# Patient Record
Sex: Male | Born: 1945
Health system: Southern US, Community
[De-identification: ages and names within clinical notes are randomized; demographics above are authoritative.]

## PROBLEM LIST (undated history)

## (undated) DIAGNOSIS — R0600 Dyspnea, unspecified: Secondary | ICD-10-CM

## (undated) DIAGNOSIS — I739 Peripheral vascular disease, unspecified: Secondary | ICD-10-CM

## (undated) DIAGNOSIS — E785 Hyperlipidemia, unspecified: Secondary | ICD-10-CM

## (undated) DIAGNOSIS — N400 Enlarged prostate without lower urinary tract symptoms: Secondary | ICD-10-CM

## (undated) DIAGNOSIS — N401 Enlarged prostate with lower urinary tract symptoms: Secondary | ICD-10-CM

## (undated) DIAGNOSIS — Z87442 Personal history of urinary calculi: Secondary | ICD-10-CM

## (undated) DIAGNOSIS — K219 Gastro-esophageal reflux disease without esophagitis: Secondary | ICD-10-CM

## (undated) DIAGNOSIS — I6523 Occlusion and stenosis of bilateral carotid arteries: Secondary | ICD-10-CM

## (undated) DIAGNOSIS — G629 Polyneuropathy, unspecified: Secondary | ICD-10-CM

## (undated) DIAGNOSIS — I10 Essential (primary) hypertension: Secondary | ICD-10-CM

## (undated) DIAGNOSIS — N281 Cyst of kidney, acquired: Secondary | ICD-10-CM

## (undated) DIAGNOSIS — R0609 Other forms of dyspnea: Secondary | ICD-10-CM

## (undated) DIAGNOSIS — R600 Localized edema: Secondary | ICD-10-CM

## (undated) DIAGNOSIS — I779 Disorder of arteries and arterioles, unspecified: Secondary | ICD-10-CM

## (undated) DIAGNOSIS — Z86010 Personal history of colonic polyps: Principal | ICD-10-CM

## (undated) DIAGNOSIS — F419 Anxiety disorder, unspecified: Secondary | ICD-10-CM

## (undated) DIAGNOSIS — N200 Calculus of kidney: Secondary | ICD-10-CM

## (undated) DIAGNOSIS — T7840XA Allergy, unspecified, initial encounter: Secondary | ICD-10-CM

## (undated) DIAGNOSIS — N138 Other obstructive and reflux uropathy: Secondary | ICD-10-CM

## (undated) DIAGNOSIS — M1990 Unspecified osteoarthritis, unspecified site: Secondary | ICD-10-CM

## (undated) HISTORY — PX: CERVICAL DISC SURGERY: SHX588

## (undated) HISTORY — DX: Allergy, unspecified, initial encounter: T78.40XA

## (undated) HISTORY — PX: POLYPECTOMY: SHX149

## (undated) HISTORY — DX: Peripheral vascular disease, unspecified: I73.9

## (undated) HISTORY — DX: Personal history of colonic polyps: Z86.010

## (undated) HISTORY — DX: Essential (primary) hypertension: I10

## (undated) HISTORY — DX: Gastro-esophageal reflux disease without esophagitis: K21.9

## (undated) HISTORY — PX: COLONOSCOPY: SHX174

## (undated) HISTORY — DX: Disorder of arteries and arterioles, unspecified: I77.9

## (undated) HISTORY — DX: Benign prostatic hyperplasia without lower urinary tract symptoms: N40.0

## (undated) HISTORY — PX: BURN TREATMENT: SHX158

## (undated) HISTORY — DX: Hyperlipidemia, unspecified: E78.5

## (undated) HISTORY — DX: Anxiety disorder, unspecified: F41.9

## (undated) HISTORY — PX: OTHER SURGICAL HISTORY: SHX169

---

## 2004-03-05 ENCOUNTER — Ambulatory Visit: Payer: Self-pay | Admitting: Internal Medicine

## 2004-08-30 ENCOUNTER — Ambulatory Visit: Payer: Self-pay | Admitting: Internal Medicine

## 2004-10-08 ENCOUNTER — Ambulatory Visit: Payer: Self-pay | Admitting: Internal Medicine

## 2005-06-06 ENCOUNTER — Ambulatory Visit: Payer: Self-pay | Admitting: Internal Medicine

## 2005-11-26 ENCOUNTER — Ambulatory Visit: Payer: Self-pay | Admitting: Family Medicine

## 2005-12-06 ENCOUNTER — Ambulatory Visit: Payer: Self-pay | Admitting: Internal Medicine

## 2005-12-13 ENCOUNTER — Ambulatory Visit: Payer: Self-pay | Admitting: Internal Medicine

## 2006-01-07 LAB — HM COLONOSCOPY: HM Colonoscopy: NORMAL

## 2006-05-19 ENCOUNTER — Telehealth (INDEPENDENT_AMBULATORY_CARE_PROVIDER_SITE_OTHER): Payer: Self-pay | Admitting: *Deleted

## 2006-05-20 ENCOUNTER — Ambulatory Visit: Payer: Self-pay | Admitting: Internal Medicine

## 2006-05-20 DIAGNOSIS — E785 Hyperlipidemia, unspecified: Secondary | ICD-10-CM

## 2006-05-20 DIAGNOSIS — N138 Other obstructive and reflux uropathy: Secondary | ICD-10-CM

## 2006-05-20 DIAGNOSIS — B029 Zoster without complications: Secondary | ICD-10-CM | POA: Insufficient documentation

## 2006-05-20 DIAGNOSIS — I10 Essential (primary) hypertension: Secondary | ICD-10-CM

## 2006-05-20 DIAGNOSIS — N529 Male erectile dysfunction, unspecified: Secondary | ICD-10-CM | POA: Insufficient documentation

## 2006-05-20 DIAGNOSIS — F411 Generalized anxiety disorder: Secondary | ICD-10-CM | POA: Insufficient documentation

## 2006-05-20 DIAGNOSIS — N401 Enlarged prostate with lower urinary tract symptoms: Secondary | ICD-10-CM

## 2006-05-21 LAB — CONVERTED CEMR LAB
Albumin: 3.9 g/dL (ref 3.5–5.2)
GFR calc Af Amer: 111 mL/min
Glucose, Bld: 102 mg/dL — ABNORMAL HIGH (ref 70–99)
Potassium: 4.4 meq/L (ref 3.5–5.1)
Sodium: 140 meq/L (ref 135–145)

## 2006-06-16 ENCOUNTER — Encounter: Payer: Self-pay | Admitting: Internal Medicine

## 2006-06-16 DIAGNOSIS — J309 Allergic rhinitis, unspecified: Secondary | ICD-10-CM | POA: Insufficient documentation

## 2006-06-19 ENCOUNTER — Encounter (INDEPENDENT_AMBULATORY_CARE_PROVIDER_SITE_OTHER): Payer: Self-pay | Admitting: *Deleted

## 2006-10-06 ENCOUNTER — Ambulatory Visit: Payer: Self-pay | Admitting: Gastroenterology

## 2006-10-06 DIAGNOSIS — Z860100 Personal history of colon polyps, unspecified: Secondary | ICD-10-CM

## 2006-10-06 HISTORY — DX: Personal history of colon polyps, unspecified: Z86.0100

## 2006-10-06 HISTORY — DX: Personal history of colonic polyps: Z86.010

## 2008-01-23 ENCOUNTER — Emergency Department: Payer: Self-pay | Admitting: Emergency Medicine

## 2008-03-03 ENCOUNTER — Emergency Department: Payer: Self-pay | Admitting: Emergency Medicine

## 2008-06-26 ENCOUNTER — Emergency Department (HOSPITAL_COMMUNITY): Admission: EM | Admit: 2008-06-26 | Discharge: 2008-06-26 | Payer: Self-pay | Admitting: Emergency Medicine

## 2013-05-28 ENCOUNTER — Ambulatory Visit
Admission: RE | Admit: 2013-05-28 | Discharge: 2013-05-28 | Disposition: A | Discharge status: Home / Self Care | Payer: Commercial Managed Care - HMO | Source: Ambulatory Visit | Attending: Nurse Practitioner | Admitting: Nurse Practitioner

## 2013-05-28 ENCOUNTER — Ambulatory Visit
Admission: RE | Admit: 2013-05-28 | Discharge: 2013-05-28 | Disposition: A | Discharge status: Home / Self Care | Payer: Medicare HMO | Source: Ambulatory Visit | Attending: Nurse Practitioner | Admitting: Nurse Practitioner

## 2013-05-28 ENCOUNTER — Other Ambulatory Visit: Payer: Self-pay | Admitting: Nurse Practitioner

## 2013-05-28 DIAGNOSIS — M79671 Pain in right foot: Secondary | ICD-10-CM

## 2013-05-28 DIAGNOSIS — M79672 Pain in left foot: Principal | ICD-10-CM

## 2013-08-04 ENCOUNTER — Telehealth: Payer: Self-pay | Admitting: Internal Medicine

## 2013-08-04 NOTE — Telephone Encounter (Signed)
Brian Clarke from [facility] called office stating that pt has [place] insurance with your name listed as his PCP. Delia Chimes, DNP has been treating patient since 04/2013 and has sent him to see Dr. Einar Gip in 05/2013 and 06/2013 and now needs follow up and [employer] is requiring referral. Would you be able to accommodate pt to re-est and put referral in for Dr. Einar Gip? If you would like to speak with Delia Chimes her office # is [phone]

## 2013-08-05 NOTE — Telephone Encounter (Signed)
You can try to get him in soon to reestablish but I can't approve referral till I see him. Please get the records from Dr Toy Cookey   Please call her office to see if I need to call her---like if she thinks he has an emergent situation or something life threatening, etc

## 2013-08-05 NOTE — Telephone Encounter (Signed)
Spoke with Dr. Corky Sing office. No it is not life threatening. LVM for pt to call back and ask for me to schedule re-est pt appt. Dr. Toy Cookey and Dr. Einar Gip notes in your in-box.

## 2013-08-06 NOTE — Telephone Encounter (Signed)
Records reviewed  Claudication with subclinical LE arterial disease Will review at his upcoming visit here

## 2013-08-20 ENCOUNTER — Ambulatory Visit (INDEPENDENT_AMBULATORY_CARE_PROVIDER_SITE_OTHER): Payer: Commercial Managed Care - HMO | Admitting: Internal Medicine

## 2013-08-20 ENCOUNTER — Encounter: Payer: Self-pay | Admitting: Internal Medicine

## 2013-08-20 ENCOUNTER — Encounter (INDEPENDENT_AMBULATORY_CARE_PROVIDER_SITE_OTHER): Payer: Self-pay

## 2013-08-20 VITALS — BP 140/70 | HR 69 | Temp 98.0°F | Ht 69.0 in | Wt 234.0 lb

## 2013-08-20 DIAGNOSIS — I739 Peripheral vascular disease, unspecified: Secondary | ICD-10-CM

## 2013-08-20 DIAGNOSIS — E785 Hyperlipidemia, unspecified: Secondary | ICD-10-CM

## 2013-08-20 DIAGNOSIS — Z23 Encounter for immunization: Secondary | ICD-10-CM

## 2013-08-20 DIAGNOSIS — I10 Essential (primary) hypertension: Secondary | ICD-10-CM

## 2013-08-20 DIAGNOSIS — N400 Enlarged prostate without lower urinary tract symptoms: Secondary | ICD-10-CM

## 2013-08-20 NOTE — Patient Instructions (Signed)
Exercise to Lose Weight Exercise and a healthy diet may help you lose weight. Your doctor may suggest specific exercises. EXERCISE IDEAS AND TIPS  Choose low-cost things you enjoy doing, such as walking, bicycling, or exercising to workout videos.  Take stairs instead of the elevator.  Walk during your lunch break.  Park your car further away from work or school.  Go to a gym or an exercise class.  Start with 5 to 10 minutes of exercise each day. Build up to 30 minutes of exercise 4 to 6 days a week.  Wear shoes with good support and comfortable clothes.  Stretch before and after working out.  Work out until you breathe harder and your heart beats faster.  Drink extra water when you exercise.  Do not do so much that you hurt yourself, feel dizzy, or get very short of breath. Exercises that burn about 150 calories:  Running 1  miles in 15 minutes.  Playing volleyball for 45 to 60 minutes.  Washing and waxing a car for 45 to 60 minutes.  Playing touch football for 45 minutes.  Walking 1  miles in 35 minutes.  Pushing a stroller 1  miles in 30 minutes.  Playing basketball for 30 minutes.  Raking leaves for 30 minutes.  Bicycling 5 miles in 30 minutes.  Walking 2 miles in 30 minutes.  Dancing for 30 minutes.  Shoveling snow for 15 minutes.  Swimming laps for 20 minutes.  Walking up stairs for 15 minutes.  Bicycling 4 miles in 15 minutes.  Gardening for 30 to 45 minutes.  Jumping rope for 15 minutes.  Washing windows or floors for 45 to 60 minutes. Document Released: 01/26/2010 Document Revised: 03/18/2011 Document Reviewed: 01/26/2010 ExitCare Patient Information 2015 Exit[employer]. This information is not intended to replace advice given to you by your health care provider. Make sure you discuss any questions you have with your health care provider. DASH Eating Plan DASH stands for "Dietary Approaches to Stop Hypertension." The DASH eating plan is a  healthy eating plan that has been shown to reduce high blood pressure (hypertension). Additional health benefits may include reducing the risk of type 2 diabetes mellitus, heart disease, and stroke. The DASH eating plan may also help with weight loss. WHAT DO I NEED TO KNOW ABOUT THE DASH EATING PLAN? For the DASH eating plan, you will follow these general guidelines:  Choose foods with a percent daily value for sodium of less than 5% (as listed on the food label).  Use salt-free seasonings or herbs instead of table salt or sea salt.  Check with your health care provider or pharmacist before using salt substitutes.  Eat lower-sodium products, often labeled as "lower sodium" or "no salt added."  Eat fresh foods.  Eat more vegetables, fruits, and low-fat dairy products.  Choose whole grains. Look for the word "whole" as the first word in the ingredient list.  Choose fish and skinless chicken or turkey more often than red meat. Limit fish, poultry, and meat to 6 oz (170 g) each day.  Limit sweets, desserts, sugars, and sugary drinks.  Choose heart-healthy fats.  Limit cheese to 1 oz (28 g) per day.  Eat more home-cooked food and less restaurant, buffet, and fast food.  Limit fried foods.  Cook foods using methods other than frying.  Limit canned vegetables. If you do use them, rinse them well to decrease the sodium.  When eating at a restaurant, ask that your food be prepared with   less salt, or no salt if possible. WHAT FOODS CAN I EAT? Seek help from a dietitian for individual calorie needs. Grains Whole grain or whole wheat bread. Brown rice. Whole grain or whole wheat pasta. Quinoa, bulgur, and whole grain cereals. Low-sodium cereals. Corn or whole wheat flour tortillas. Whole grain cornbread. Whole grain crackers. Low-sodium crackers. Vegetables Fresh or frozen vegetables (raw, steamed, roasted, or grilled). Low-sodium or reduced-sodium tomato and vegetable juices. Low-sodium  or reduced-sodium tomato sauce and paste. Low-sodium or reduced-sodium canned vegetables.  Fruits All fresh, canned (in natural juice), or frozen fruits. Meat and Other Protein Products Ground beef (85% or leaner), grass-fed beef, or beef trimmed of fat. Skinless chicken or turkey. Ground chicken or turkey. Pork trimmed of fat. All fish and seafood. Eggs. Dried beans, peas, or lentils. Unsalted nuts and seeds. Unsalted canned beans. Dairy Low-fat dairy products, such as skim or 1% milk, 2% or reduced-fat cheeses, low-fat ricotta or cottage cheese, or plain low-fat yogurt. Low-sodium or reduced-sodium cheeses. Fats and Oils Tub margarines without trans fats. Light or reduced-fat mayonnaise and salad dressings (reduced sodium). Avocado. Safflower, olive, or canola oils. Natural peanut or almond butter. Other Unsalted popcorn and pretzels. The items listed above may not be a complete list of recommended foods or beverages. Contact your dietitian for more options. WHAT FOODS ARE NOT RECOMMENDED? Grains White bread. White pasta. White rice. Refined cornbread. Bagels and croissants. Crackers that contain trans fat. Vegetables Creamed or fried vegetables. Vegetables in a cheese sauce. Regular canned vegetables. Regular canned tomato sauce and paste. Regular tomato and vegetable juices. Fruits Dried fruits. Canned fruit in light or heavy syrup. Fruit juice. Meat and Other Protein Products Fatty cuts of meat. Ribs, chicken wings, bacon, sausage, bologna, salami, chitterlings, fatback, hot dogs, bratwurst, and packaged luncheon meats. Salted nuts and seeds. Canned beans with salt. Dairy Whole or 2% milk, cream, half-and-half, and cream cheese. Whole-fat or sweetened yogurt. Full-fat cheeses or blue cheese. Nondairy creamers and whipped toppings. Processed cheese, cheese spreads, or cheese curds. Condiments Onion and garlic salt, seasoned salt, table salt, and sea salt. Canned and packaged gravies.  Worcestershire sauce. Tartar sauce. Barbecue sauce. Teriyaki sauce. Soy sauce, including reduced sodium. Steak sauce. Fish sauce. Oyster sauce. Cocktail sauce. Horseradish. Ketchup and mustard. Meat flavorings and tenderizers. Bouillon cubes. Hot sauce. Tabasco sauce. Marinades. Taco seasonings. Relishes. Fats and Oils Butter, stick margarine, lard, shortening, ghee, and bacon fat. Coconut, palm kernel, or palm oils. Regular salad dressings. Other Pickles and olives. Salted popcorn and pretzels. The items listed above may not be a complete list of foods and beverages to avoid. Contact your dietitian for more information. WHERE CAN I FIND MORE INFORMATION? National Heart, Lung, and Blood Institute: www.nhlbi.nih.gov/health/health-topics/topics/dash/ Document Released: 12/13/2010 Document Revised: 05/10/2013 Document Reviewed: 10/28/2012 ExitCare Patient Information 2015 ExitCare, [employer]. This information is not intended to replace advice given to you by your health care provider. Make sure you discuss any questions you have with your health care provider.  

## 2013-08-20 NOTE — Progress Notes (Signed)
Pre visit review using our clinic review tool, if applicable. No additional management support is needed unless otherwise documented below in the visit note. 

## 2013-08-20 NOTE — Assessment & Plan Note (Signed)
On statin Labs next time

## 2013-08-20 NOTE — Addendum Note (Signed)
Addended by: Despina Hidden on: 08/20/2013 09:43 AM   Modules accepted: Orders

## 2013-08-20 NOTE — Assessment & Plan Note (Signed)
Mild symptoms Will continue the doxazosin for now
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
## 2013-08-20 NOTE — Assessment & Plan Note (Signed)
BP Readings from Last 3 Encounters:  08/20/13 140/70  05/20/06 146/94   Good control now No changes Had recent labs with Dr Anette Riedel defer

## 2013-08-20 NOTE — Assessment & Plan Note (Signed)
Recent diagnosis Has appt again today with Dr Einar Gip Fortunately, he has stopped smoking

## 2013-08-27 ENCOUNTER — Encounter (HOSPITAL_COMMUNITY): Payer: Self-pay | Admitting: Pharmacy Technician

## 2013-09-07 ENCOUNTER — Observation Stay (HOSPITAL_COMMUNITY)
Admission: RE | Admit: 2013-09-07 | Discharge: 2013-09-09 | Disposition: A | Discharge status: Home / Self Care | Payer: Medicare HMO | Source: Ambulatory Visit | Attending: Cardiology | Admitting: Cardiology

## 2013-09-07 ENCOUNTER — Encounter (HOSPITAL_COMMUNITY)
Admission: RE | Disposition: A | Discharge status: Home / Self Care | Payer: Self-pay | Source: Ambulatory Visit | Attending: Cardiology

## 2013-09-07 ENCOUNTER — Encounter (HOSPITAL_COMMUNITY): Payer: Self-pay

## 2013-09-07 DIAGNOSIS — I10 Essential (primary) hypertension: Secondary | ICD-10-CM | POA: Insufficient documentation

## 2013-09-07 DIAGNOSIS — Y834 Other reconstructive surgery as the cause of abnormal reaction of the patient, or of later complication, without mention of misadventure at the time of the procedure: Secondary | ICD-10-CM | POA: Insufficient documentation

## 2013-09-07 DIAGNOSIS — F17200 Nicotine dependence, unspecified, uncomplicated: Secondary | ICD-10-CM | POA: Diagnosis not present

## 2013-09-07 DIAGNOSIS — I999 Unspecified disorder of circulatory system: Secondary | ICD-10-CM | POA: Insufficient documentation

## 2013-09-07 DIAGNOSIS — E785 Hyperlipidemia, unspecified: Secondary | ICD-10-CM | POA: Diagnosis not present

## 2013-09-07 DIAGNOSIS — I7779 Dissection of other artery: Secondary | ICD-10-CM | POA: Insufficient documentation

## 2013-09-07 DIAGNOSIS — F1010 Alcohol abuse, uncomplicated: Secondary | ICD-10-CM | POA: Diagnosis not present

## 2013-09-07 DIAGNOSIS — I739 Peripheral vascular disease, unspecified: Secondary | ICD-10-CM | POA: Diagnosis present

## 2013-09-07 DIAGNOSIS — I70219 Atherosclerosis of native arteries of extremities with intermittent claudication, unspecified extremity: Secondary | ICD-10-CM | POA: Diagnosis not present

## 2013-09-07 HISTORY — PX: LOWER EXTREMITY ANGIOGRAM: SHX5508

## 2013-09-07 HISTORY — PX: ANGIOPLASTY: SHX39

## 2013-09-07 LAB — URINALYSIS, ROUTINE W REFLEX MICROSCOPIC
BILIRUBIN URINE: NEGATIVE
Glucose, UA: NEGATIVE mg/dL
KETONES UR: NEGATIVE mg/dL
Leukocytes, UA: NEGATIVE
NITRITE: POSITIVE — AB
PH: 5 (ref 5.0–8.0)
Protein, ur: 100 mg/dL — AB
Specific Gravity, Urine: 1.01 (ref 1.005–1.030)
Urobilinogen, UA: 0.2 mg/dL (ref 0.0–1.0)

## 2013-09-07 LAB — POCT ACTIVATED CLOTTING TIME
ACTIVATED CLOTTING TIME: 185 s
ACTIVATED CLOTTING TIME: 202 s
ACTIVATED CLOTTING TIME: 214 s

## 2013-09-07 LAB — URINE MICROSCOPIC-ADD ON

## 2013-09-07 SURGERY — ANGIOGRAM, LOWER EXTREMITY
Anesthesia: LOCAL

## 2013-09-07 MED ORDER — ZOLPIDEM TARTRATE 5 MG PO TABS: 10.0000 mg | ORAL_TABLET | Freq: Every evening | ORAL | Status: DC | PRN

## 2013-09-07 MED ORDER — SODIUM CHLORIDE 0.9 % IV SOLN: INTRAVENOUS | Status: DC

## 2013-09-07 MED ORDER — MIDAZOLAM HCL 2 MG/2ML IJ SOLN
INTRAMUSCULAR | Status: AC
  Filled 2013-09-07: qty 2

## 2013-09-07 MED ORDER — HYDROMORPHONE HCL PF 1 MG/ML IJ SOLN
INTRAMUSCULAR | Status: AC
  Filled 2013-09-07: qty 1

## 2013-09-07 MED ORDER — METOPROLOL SUCCINATE ER 100 MG PO TB24
100.0000 mg | ORAL_TABLET | Freq: Every day | ORAL | Status: DC
  Filled 2013-09-07: qty 1

## 2013-09-07 MED ORDER — DOXAZOSIN MESYLATE 4 MG PO TABS
4.0000 mg | ORAL_TABLET | Freq: Every day | ORAL | Status: DC
  Administered 2013-09-08: 4 mg via ORAL
  Filled 2013-09-07 (×2): qty 1

## 2013-09-07 MED ORDER — HEPARIN SODIUM (PORCINE) 1000 UNIT/ML IJ SOLN
INTRAMUSCULAR | Status: AC
  Filled 2013-09-07: qty 1

## 2013-09-07 MED ORDER — CLOPIDOGREL BISULFATE 300 MG PO TABS
ORAL_TABLET | ORAL | Status: AC
  Administered 2013-09-07: 75 mg via ORAL
  Filled 2013-09-07: qty 2

## 2013-09-07 MED ORDER — SODIUM CHLORIDE 0.9 % IV BOLUS (SEPSIS)
500.0000 mL | Freq: Once | INTRAVENOUS | Status: AC
  Administered 2013-09-07: 07:00:00 via INTRAVENOUS

## 2013-09-07 MED ORDER — SODIUM CHLORIDE 0.9 % IV SOLN
1.0000 mL/kg/h | INTRAVENOUS | Status: DC
  Administered 2013-09-07 (×2): 1 mL/kg/h via INTRAVENOUS

## 2013-09-07 MED ORDER — CLOPIDOGREL BISULFATE 75 MG PO TABS
75.0000 mg | ORAL_TABLET | Freq: Every day | ORAL | Status: DC
Start: 2013-09-07 — End: 2013-09-09
  Administered 2013-09-07 – 2013-09-09 (×3): 75 mg via ORAL
  Filled 2013-09-07 (×3): qty 1

## 2013-09-07 MED ORDER — ASPIRIN EC 81 MG PO TBEC
81.0000 mg | DELAYED_RELEASE_TABLET | Freq: Every day | ORAL | Status: DC
  Administered 2013-09-08: 81 mg via ORAL
  Filled 2013-09-07 (×2): qty 1

## 2013-09-07 MED ORDER — CLOPIDOGREL BISULFATE 75 MG PO TABS: 75.0000 mg | ORAL_TABLET | Freq: Every day | ORAL | Status: DC

## 2013-09-07 MED ORDER — AMLODIPINE BESYLATE 5 MG PO TABS: 5.0000 mg | ORAL_TABLET | Freq: Every day | ORAL | Status: DC

## 2013-09-07 MED ORDER — NITROGLYCERIN 0.2 MG/ML ON CALL CATH LAB
INTRAVENOUS | Status: AC
  Filled 2013-09-07: qty 1

## 2013-09-07 MED ORDER — LABETALOL HCL 5 MG/ML IV SOLN
15.0000 mg | Freq: Once | INTRAVENOUS | Status: AC
  Administered 2013-09-07: 15 mg via INTRAVENOUS

## 2013-09-07 MED ORDER — CILOSTAZOL 100 MG PO TABS
100.0000 mg | ORAL_TABLET | Freq: Two times a day (BID) | ORAL | Status: DC
  Administered 2013-09-07 – 2013-09-08 (×3): 100 mg via ORAL
  Filled 2013-09-07 (×6): qty 1

## 2013-09-07 MED ORDER — SIMVASTATIN 20 MG PO TABS
20.0000 mg | ORAL_TABLET | Freq: Every day | ORAL | Status: DC
  Administered 2013-09-08: 17:00:00 20 mg via ORAL
  Filled 2013-09-07 (×2): qty 1

## 2013-09-07 MED ORDER — ZOLPIDEM TARTRATE 5 MG PO TABS
5.0000 mg | ORAL_TABLET | Freq: Every evening | ORAL | Status: DC | PRN
  Filled 2013-09-07: qty 1

## 2013-09-07 MED ORDER — HYDROMORPHONE HCL PF 1 MG/ML IJ SOLN
0.5000 mg | INTRAMUSCULAR | Status: DC | PRN
  Administered 2013-09-07: 0.5 mg via INTRAVENOUS
  Filled 2013-09-07 (×3): qty 1

## 2013-09-07 MED ORDER — LIDOCAINE HCL (PF) 1 % IJ SOLN
INTRAMUSCULAR | Status: AC
  Filled 2013-09-07: qty 30

## 2013-09-07 MED ORDER — LABETALOL HCL 5 MG/ML IV SOLN
INTRAVENOUS | Status: AC
  Filled 2013-09-07: qty 4

## 2013-09-07 MED ORDER — HEPARIN (PORCINE) IN NACL 2-0.9 UNIT/ML-% IJ SOLN
INTRAMUSCULAR | Status: AC
Start: 2013-09-07 — End: 2013-09-07
  Filled 2013-09-07: qty 500

## 2013-09-07 MED ORDER — FAMOTIDINE IN NACL 20-0.9 MG/50ML-% IV SOLN
INTRAVENOUS | Status: AC
  Filled 2013-09-07: qty 50

## 2013-09-07 NOTE — Progress Notes (Signed)
Upon reassessment of pt's right groin vascular site, the dressing was found to be saturated with serous drainage.  After dressing was removed, the site was oozing drainage.  Pressure was applied.  The area surrounding the vascular site was also found to be more firm compared to the shift assessment at 1915, and a bruit could be heard.  Dr. Einar Gip was notified.  An order was received to transfer patient to stepdown as well as orders for a CBC in the am, pain meds PRN, and a vascular US of the right groin in the am.  Prior to transfer, the RRN was asked to assess the patient.  Report was given to [facility] on [address].  Patient was stable upon transfer.

## 2013-09-07 NOTE — Progress Notes (Signed)
Dr. Einar Gip made aware of no need for femstop and high BP. Order taken.

## 2013-09-07 NOTE — Progress Notes (Signed)
Per Dr. Einar Gip, ok to give 1900 dose of plavix.

## 2013-09-07 NOTE — Progress Notes (Signed)
Site area: rt groin  Site Prior to Removal:  Level 0 Pressure Applied For: 35 min Manual:   yes Patient Status During Pull:  stable Post Pull Site:  Level 1, dime sized hematoma just below site that does not lesson w/ additional hold. Will notify dr. Mayra Neer Pull Instructions Given:  yes Post Pull Pulses Present: yes Dressing Applied:  yes Bedrest begins @ 1443 Comments: level 1 dime sized hematoma. VSS

## 2013-09-07 NOTE — H&P (Signed)
  Please see office visit notes for complete details of HPI.  

## 2013-09-07 NOTE — CV Procedure (Addendum)
Procedures performed: Right Femoral access Abdominal aortogram. Abdominal aortogram and crossover from right into the left femoral artery placement of catheter tip in the left femoral artery and left femoral arteriogram with distal runoff. PTA and drug-eluting balloon angioplasty of the left mid SFA with a 6.0 x 60 mm Lutonix balloon. Right femoral arteriogram with distal runoff.  Indication:  Patient is a 68 year old Caucasian male with history of tobacco use disorder, hyperlipidemia, hypertension who has been complaining about lifestyle limiting claudication in spite of aggressive medical therapy left leg worse than the right. Hence brought to the peripheral angiography suite to evaluate his peripheral anatomy. He has had outpatient Dopplers to the lower extremities, this had revealed preserved ABI, however due to abnormal physical exam and significant symptoms of claudication he was brought to the peripheral angiography suite to evaluate his peripheral anatomy.  HEMODYNAMIC DATA: A pullback was performed across the intermediate left mid SFA stenosis. There was a 30 mm pressure gradient across the stenosis. Proximal SFA stenosis was insignificant without any evidence of pressure gradient.   Peripheral arthrogram: No evidence of abdominal aneurysm. 2 renal arteries one on either side and they're widely patent. There is mild to moderate amount of atherosclerotic changes evident of the abdominal aorta and mild tortuosity percent. Aortoiliac bifurcation was widely patent.  Left femoral arteriogram: The left common femoral artery is widely patent. The left SFA in the proximal segment has a 30-40% stenosis, mid segment has a 50-60% stenosis which was felt to be probably significant and hence end hole pull-back was performed at both these lesions, mid SFA stenosis significant. Below the left knee there was one-vessel runoff in the form of peroneal artery. Anterior tibial and posterior tibial are  occluded.  Right femoral arteriogram: There is mild disease evident in the right common femoral artery and right SFA. Midsegment of the right SFA has a 30-40% stenosis. There is three-vessel runoff noted below the right knee.  Interventional data: Successful PTA and balloon angioplasty of the left mid SFA with a 6.0 x 60 mm Lutonix balloon. 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 The anterior tibial artery at the level of the left foot which was previously only felt via Dopplers, was palpable post procedure.  Recommendation: Continued aggressive risk modification, smoking cessation. I will use aspirin and Pletal and also add clopidogrel for at least 4 weeks as there was a dissection evident at the balloon antiplasty site. Discharge home today with outpatient followup. D/W wife and daughter in law about the PTA results and discussed acute occlusion, signs of acute occlusion and management and to have low threshold to come to ED if there is any pain in the left leg  Technique of procedure: Using sterile precautions, using a 5 French right femoral arterial access, an Omni flush catheter was advanced into the abdominal aorta over the Versacore wire and abdominal aortogram was performed. The same catheter was utilized to cross over from the right into the left femoral artery and the tip of the catheter was placed in the left external iliac artery and angiography was performed.  Following this I proceeded with intervention to the left SFA after I had used a 4 [place] and the catheter to perform pullback across the left SFA intermediate lesion. Using heparin for anticoagulation, I used a 7 French Terumo destination sheath, 45 cm and placed it in the left femoral artery. Using heparin and keeping the ACT greater than 200, I initially performed balloon  angioplasty with a 6.0 x 60 mm [place] balloon for 150 seconds and this was followed by balloon angioplasty with drug coated  balloon. The results were excellent although there was dissection it was not flow-limiting. The wire was withdrawn and angiography repeated to confirm non-limitation flow. The sheath was then pulled back into the right common femoral artery and exchange for a short sheath 7 [place] and sutured in place. Right femoral arteriogram was performed via arterial access sheath. Patient tolerated the procedure. There was no immediate complication.  Brian Clarke

## 2013-09-07 NOTE — Progress Notes (Addendum)
Patient called w/callbell that he was bleeding. Immediate pressure held to rt groin, dressing saturated. AMitchell holding manual pressure.  Dr. Einar Gip called and made aware. Femstop ordered.

## 2013-09-07 NOTE — Discharge Instructions (Signed)

## 2013-09-07 NOTE — Progress Notes (Signed)
Hemostasis obtained. Dime sized hematoma remains at site. Tegaderm dressing to rt groin. Faint bruising rt groin.

## 2013-09-07 NOTE — Interval H&P Note (Signed)
History and Physical Interval Note:  09/07/2013 8:49 AM  Brian Clarke  has presented today for surgery, with the diagnosis of claudication  The various methods of treatment have been discussed with the patient and family. After consideration of risks, benefits and other options for treatment, the patient has consented to  Procedure(s): LOWER EXTREMITY ANGIOGRAM (N/A) abdominal aortogram and possible PTA as a surgical intervention .  The patient's history has been reviewed, patient examined, no change in status, stable for surgery.  I have reviewed the patient's chart and labs.  Questions were answered to the patient's satisfaction.     [name]

## 2013-09-08 DIAGNOSIS — I70219 Atherosclerosis of native arteries of extremities with intermittent claudication, unspecified extremity: Secondary | ICD-10-CM | POA: Diagnosis not present

## 2013-09-08 DIAGNOSIS — M79609 Pain in unspecified limb: Secondary | ICD-10-CM

## 2013-09-08 LAB — CBC
HCT: 32.9 % — ABNORMAL LOW (ref 39.0–52.0)
HEMOGLOBIN: 11.6 g/dL — AB (ref 13.0–17.0)
MCH: 34.1 pg — AB (ref 26.0–34.0)
MCHC: 35.3 g/dL (ref 30.0–36.0)
MCV: 96.8 fL (ref 78.0–100.0)
Platelets: 222 10*3/uL (ref 150–400)
RBC: 3.4 MIL/uL — AB (ref 4.22–5.81)
RDW: 12.4 % (ref 11.5–15.5)
WBC: 9.4 10*3/uL (ref 4.0–10.5)

## 2013-09-08 LAB — BASIC METABOLIC PANEL
Anion gap: 11 (ref 5–15)
BUN: 9 mg/dL (ref 6–23)
CALCIUM: 8.4 mg/dL (ref 8.4–10.5)
CO2: 23 meq/L (ref 19–32)
Chloride: 108 mEq/L (ref 96–112)
Creatinine, Ser: 0.93 mg/dL (ref 0.50–1.35)
GFR calc Af Amer: >90 mL/min (ref >90)
GFR calc non Af Amer: 85 mL/min — ABNORMAL LOW (ref >90)
GLUCOSE: 117 mg/dL — AB (ref 70–99)
POTASSIUM: 4.1 meq/L (ref 3.7–5.3)
Sodium: 142 mEq/L (ref 137–147)

## 2013-09-08 LAB — POCT ACTIVATED CLOTTING TIME: ACTIVATED CLOTTING TIME: 180 s

## 2013-09-08 MED ORDER — NITROGLYCERIN 0.4 MG SL SUBL
SUBLINGUAL_TABLET | SUBLINGUAL | Status: AC
  Filled 2013-09-08: qty 1

## 2013-09-08 MED ORDER — HYDRALAZINE HCL 25 MG PO TABS
25.0000 mg | ORAL_TABLET | Freq: Three times a day (TID) | ORAL | Status: DC
  Administered 2013-09-08 – 2013-09-09 (×4): 25 mg via ORAL
  Filled 2013-09-08 (×7): qty 1

## 2013-09-08 MED ORDER — HYDRALAZINE HCL 20 MG/ML IJ SOLN
10.0000 mg | INTRAMUSCULAR | Status: DC | PRN
  Administered 2013-09-08: 10 mg via INTRAVENOUS
  Filled 2013-09-08: qty 1

## 2013-09-08 MED ORDER — THROMBIN 5000 UNITS EX SOLR
5000.0000 [IU] | Freq: Once | CUTANEOUS | Status: DC
  Filled 2013-09-08: qty 5000

## 2013-09-08 MED ORDER — SPIRONOLACTONE 50 MG PO TABS
50.0000 mg | ORAL_TABLET | Freq: Every morning | ORAL | Status: DC
  Administered 2013-09-08: 10:00:00 50 mg via ORAL
  Filled 2013-09-08 (×2): qty 1

## 2013-09-08 MED ORDER — HYDROMORPHONE HCL PF 1 MG/ML IJ SOLN
0.5000 mg | Freq: Once | INTRAMUSCULAR | Status: AC
  Administered 2013-09-08: 11:00:00 0.5 mg via INTRAVENOUS

## 2013-09-08 MED ORDER — METOPROLOL SUCCINATE ER 100 MG PO TB24
100.0000 mg | ORAL_TABLET | Freq: Two times a day (BID) | ORAL | Status: DC
  Administered 2013-09-08 – 2013-09-09 (×3): 100 mg via ORAL
  Filled 2013-09-08 (×4): qty 1

## 2013-09-08 MED ORDER — NITROGLYCERIN 0.4 MG SL SUBL
0.4000 mg | SUBLINGUAL_TABLET | Freq: Once | SUBLINGUAL | Status: AC
  Administered 2013-09-08: 0.4 mg via SUBLINGUAL

## 2013-09-08 MED ORDER — HYDROMORPHONE HCL PF 1 MG/ML IJ SOLN
1.0000 mg | Freq: Once | INTRAMUSCULAR | Status: AC
  Administered 2013-09-08: 10:00:00 1 mg via INTRAVENOUS

## 2013-09-08 MED ORDER — HYDRALAZINE HCL 20 MG/ML IJ SOLN
10.0000 mg | Freq: Once | INTRAMUSCULAR | Status: AC
  Administered 2013-09-08: 11:00:00 10 mg via INTRAVENOUS

## 2013-09-08 MED ORDER — LIDOCAINE-EPINEPHRINE 0.5 %-1:200000 IJ SOLN
30.0000 mL | INTRAMUSCULAR | Status: AC
  Administered 2013-09-08: 2 mL
  Filled 2013-09-08: qty 30

## 2013-09-08 MED ORDER — AMLODIPINE BESYLATE 10 MG PO TABS
10.0000 mg | ORAL_TABLET | Freq: Every day | ORAL | Status: DC
  Administered 2013-09-08 (×2): 10 mg via ORAL
  Filled 2013-09-08 (×3): qty 1

## 2013-09-08 MED ORDER — LABETALOL HCL 5 MG/ML IV SOLN
15.0000 mg | INTRAVENOUS | Status: DC | PRN
  Administered 2013-09-08: 15 mg via INTRAVENOUS
  Filled 2013-09-08: qty 4

## 2013-09-08 NOTE — Progress Notes (Signed)
UR Completed.  Brian Clarke Jane [phone] 09/08/2013  

## 2013-09-08 NOTE — Progress Notes (Signed)
Patient arrived to floor for groin management he arrived with level 2 hematoma to right groin.  Dr.Ganji notified and updated on patient. Femstop applied and BP meds ordered I will continue to monitor.

## 2013-09-08 NOTE — Procedures (Signed)
US guided right femoral artery pseudoaneurysm compression: Initially performed this. There was persistence of the aneurysm sac. We had prepared for thrombin injection and by this time, there was complete thrombosis of the sac. Hence thrombin was not injected.

## 2013-09-08 NOTE — Progress Notes (Addendum)
Subjective:  Right groin pain. But otherwise doing well   Objective:  Vital Signs in the last 24 hours: Temp:  [97.6 F (36.4 C)-99.1 F (37.3 C)] 98 F (36.7 C) (09/02 0800) Pulse Rate:  [43-111] 94 (09/02 0800) Resp:  [8-22] 20 (09/02 0800) BP: (123-222)/(69-128) 173/79 mmHg (09/02 0800) SpO2:  [93 %-100 %] 100 % (09/02 0800) Weight:  [104.3 kg (229 lb 15 oz)-104.327 kg (230 lb)] 104.3 kg (229 lb 15 oz) (09/02 0023)  Intake/Output from previous day: 09/01 0701 - 09/02 0700 In: 360 [P.O.:360] Out: 1600 [Urine:1600]  Physical Exam:   General appearance: alert, cooperative, appears stated age, no distress and moderately obese Eyes: negative findings: lids and lashes normal Neck: no adenopathy, no carotid bruit, no JVD and supple, symmetrical, trachea midline Neck: JVP - normal, carotids 2+= without bruits Resp: clear to auscultation bilaterally Chest wall: no tenderness Cardio: regular rate and rhythm, S1, S2 normal, no murmur, click, rub or gallop GI: soft, non-tender; bowel sounds normal; no masses,  no organomegaly and Large abdomen with mild pannus Extremities: extremities normal, atraumatic, no cyanosis or edema and Right groin ecchymosis present with hematoma. Bruit present. Left DP faint and palpable. Left leg warm and no e/o arterial insufficiency    Lab Results: BMP  Recent Labs  09/08/13 0236  NA 142  K 4.1  CL 108  CO2 23  GLUCOSE 117*  BUN 9  CREATININE 0.93  CALCIUM 8.4  GFRNONAA 85*  GFRAA >90    CBC  Recent Labs Lab 09/08/13 0236  WBC 9.4  RBC 3.40*  HGB 11.6*  HCT 32.9*  PLT 222  MCV 96.8  MCH 34.1*  MCHC 35.3  RDW 12.4    HEMOGLOBIN A1C No results found for this basename: HGBA1C, MPG    Cardiac Panel (last 3 results) No results found for this basename: CKTOTAL, CKMB, TROPONINI, RELINDX,  in the last 8760 hours  BNP (last 3 results) No results found for this basename: PROBNP,  in the last 8760 hours  TSH No results found  for this basename: TSH,  in the last 8760 hours  CHOLESTEROL No results found for this basename: CHOL,  in the last 8760 hours  Hepatic Function Panel No results found for this basename: PROT, ALBUMIN, AST, ALT, ALKPHOS, BILITOT, BILIDIR, IBILI,  in the last 8760 hours   EKG:  Tele: NSR  Assessment/Plan:  1. 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 Very long neck and 2 cm distance to native femoral artery.  Failed US guided compression 2. PAD S/P PTA left SFA.  Rec: proceed with US guided thrombin injection. Patient aware of  Embolic complication and need for urgent surgical thrombectomy.    Add: Patient thrombosed the pseudoaneurysm sac with US guided compression for 10 minutes. Thrombin not injected. Post procedure observation: Bruit absent  [name], M.D. 09/08/2013, 10:37 AM [facility] Pager: [phone] Office: [phone] If no answer: [phone]

## 2013-09-08 NOTE — Progress Notes (Addendum)
*  PRELIMINARY RESULTS* Vascular Ultrasound Right femoral artery duplex has been completed.   There is evidence of a pseudoaneurysm of the right femoral artery measuring 4.6 x 2.3cm with a neck measuring 1.4 x 0.6cm.  After 9 minutes of compression, the pseudoaneurysm was still patent. Following 35 minutes of rest, the pseudoaneurysm and neck appear to be thrombosed with no internal flow noted.  Recheck of pseudoaneurysm completed at 7:00  P.M. Still appears thrombosed with no evidence of internal flow. Common femoral and superficial femoral artery remain patent with triphasic flow  09/08/2013 12:01 PM Maudry Mayhew, RVT, RDCS, RDMS   09/08/2013 7:00 PM [facility], [place]

## 2013-09-09 DIAGNOSIS — I70219 Atherosclerosis of native arteries of extremities with intermittent claudication, unspecified extremity: Secondary | ICD-10-CM | POA: Diagnosis not present

## 2013-09-09 MED ORDER — HYDRALAZINE HCL 25 MG PO TABS: 25.0000 mg | ORAL_TABLET | Freq: Three times a day (TID) | ORAL | Status: DC

## 2013-09-09 MED ORDER — CLOPIDOGREL BISULFATE 75 MG PO TABS: 75.0000 mg | ORAL_TABLET | Freq: Every day | ORAL | Status: DC

## 2013-09-09 MED ORDER — METOPROLOL SUCCINATE ER 100 MG PO TB24: 100.0000 mg | ORAL_TABLET | Freq: Two times a day (BID) | ORAL | Status: DC

## 2013-09-09 MED ORDER — SPIRONOLACTONE 50 MG PO TABS: 50.0000 mg | ORAL_TABLET | Freq: Every evening | ORAL | Status: DC

## 2013-09-09 MED ORDER — AMLODIPINE BESYLATE 10 MG PO TABS: 10.0000 mg | ORAL_TABLET | Freq: Every day | ORAL | Status: DC

## 2013-09-09 NOTE — Progress Notes (Signed)
UR Completed.  [phone]  

## 2013-09-09 NOTE — Care Management Note (Addendum)
  Page 1 of 1   09/09/2013     11:56:28 AM CARE MANAGEMENT NOTE 09/09/2013  Patient:  Brian Clarke, Brian Clarke   Account Number:  [id number]  Date Initiated:  09/09/2013  Documentation initiated by:  Tationa Stech  Subjective/Objective Assessment:   claudication     Action/Plan:   CM to follow for dispostion needs   Anticipated DC Date:  09/09/2013   Anticipated DC Plan:  HOME/SELF CARE         Choice offered to / List presented to:             Status of service:  Completed, signed off Medicare Important Message given?   (If response is "NO", the following Medicare IM given date fields will be blank) Date Medicare IM given:   Medicare IM given by:   Date Additional Medicare IM given:   Additional Medicare IM given by:    Discharge Disposition:  HOME/SELF CARE  Per UR Regulation:    If discussed at Long Length of Stay Meetings, dates discussed:    Comments:  Miley Blanchett RN, BSN, MSHL, CCM  Nurse - Case Manager,  (Unit [phone]  09/09/2013 Med Review:  Plavix Home / self care

## 2013-09-09 NOTE — Discharge Summary (Signed)
Physician Discharge Summary  Patient ID: Brian Clarke MRN: [id number] DOB/AGE: [id number] y.o.  Admit date: 09/07/2013 Discharge date: 09/09/2013  Primary Discharge Diagnosis PAD and claudication. S/P PTA and drug-eluting balloon angioplasty of the left mid SFA with a 6.0 x 60 mm Lutonix balloon on 09/07/2013.  Secondary  Right femoral artery pseudoaneurysm, successful ultrasound guided compression and thrombosis of the pseudoaneurysm sac on 09/08/2013 Hypertension Hyperlipidemia Tobacco use disorder, has remained abstinent Excessive alcohol intake  Significant Diagnostic Studies: 09/07/2013 Abdominal aortogram. Abdominal aortogram and crossover from right into the left femoral artery placement of catheter tip in the left femoral artery and left femoral arteriogram with distal runoff. PTA and drug-eluting balloon angioplasty of the left mid SFA with a 6.0 x 60 mm Lutonix balloon.  Right femoral arteriogram with distal runoff.  Peripheral arthrogram: No evidence of abdominal aneurysm. 2 renal arteries one on either side and they're widely patent. There is mild to moderate amount of atherosclerotic changes evident of the abdominal aorta and mild tortuosity percent. Aortoiliac bifurcation was widely patent.  Left femoral arteriogram: The left common femoral artery is widely patent. The left SFA in the proximal segment has a 30-40% stenosis, mid segment has a 50-60% stenosis which was felt to be probably significant and hence end hole pull-back was performed at both these lesions, mid SFA stenosis significant. Below the left knee there was one-vessel runoff in the form of peroneal artery. Anterior tibial and posterior tibial are occluded.  Right femoral arteriogram: There is mild disease evident in the right common femoral artery and right SFA. Midsegment of the right SFA has a 30-40% stenosis. There is three-vessel runoff noted below the right knee.  09/08/2013:      US guided right femoral artery  pseudoaneurysm compression: Initially performed this. There was persistence of the aneurysm sac. We had prepared for thrombin injection and by this time, there was complete thrombosis of the sac.   Hospital Course: patient was admitted to the hospital for elective to peripheral arteriogram.  He underwent successful angioplasty to the left SFA, however on the day of the procedure he developed a small hematoma in the evening and uncontrolled hypertension.  Eventually this led to a moderate-sized hematoma and formation of a right femoral artery pseudoaneurysm.  Hence patient was kept overnight, but due to the presence of pseudoaneurysm, he underwent compression via ultrasound guidance, with complete obliteration of the pseudoaneurysm sac.  Repeat ultrasound in the evening of the procedure also revealed persistent closure and thrombosis.  He had warm feet bilaterally, very faint palpable left pedal pulse, previously absent prior to PTA.  Hence felt stable for discharge.   Recommendations on discharge: patient has been started on Plavix, he'll continue this at least for a period of  4-[redacted] weeks along with aspirin and Pletal.  I will see him back in the office with repeat lower activity arterial duplex prior to my office visit to follow-up on balloon angioplasty as there was dissection evident, however the dissection was retrograde and was not flow-limiting.  I'll also repeat Dopplers to the right lower extremity to confirm complete augmentation of the right femoral pseudoaneurysm.  I have discussed with the patient regarding abstinence from tobacco and also to significantly reduced alcohol intake or even complete abstinence from drinking alcohol.  Discharge Exam: Blood pressure 132/71, pulse 78, temperature 98.2 F (36.8 C), temperature source Oral, resp. rate 20, height 5\' 9"  (1.753 m), weight 104.3 kg (229 lb 15 oz), SpO2 99.00%.   General  appearance: alert, cooperative, appears stated age, no distress and  moderately obese  Eyes: negative findings: lids and lashes normal  Neck: no adenopathy, no carotid bruit, no JVD and supple, symmetrical, trachea midline  Neck: JVP - normal, carotids 2+= without bruits  Resp: clear to auscultation bilaterally  Chest wall: no tenderness  Cardio: regular rate and rhythm, S1, S2 normal, no murmur, click, rub or gallop  GI: soft, non-tender; bowel sounds normal; no masses, no organomegaly and Large abdomen with mild pannus  Extremities: extremities normal, atraumatic, no cyanosis or edema and Right groin ecchymosis present with hematoma. Bruit absent. Left DP faint and palpable. Bilateral legs warm and no e/o arterial insufficiency   Labs:   Lab Results  Component Value Date   WBC 9.4 09/08/2013   HGB 11.6* 09/08/2013   HCT 32.9* 09/08/2013   MCV 96.8 09/08/2013   PLT 222 09/08/2013    Recent Labs Lab 09/08/13 0236  NA 142  K 4.1  CL 108  CO2 23  BUN 9  CREATININE 0.93  CALCIUM 8.4  GLUCOSE 117*    Radiology: [place] Lower Ext Art Bilat  09/08/2013   [name], MD     09/08/2013 11:13 AM US guided right femoral artery pseudoaneurysm compression:  Initially performed this. There was persistence of the aneurysm  sac. We had prepared for thrombin injection and by this time,  there was complete thrombosis of the sac. Hence thrombin was not  injected.      FOLLOW UP PLANS AND APPOINTMENTS    Medication List    STOP taking these medications       naproxen sodium 220 MG tablet  Commonly known as:  ANAPROX      TAKE these medications       amLODipine 10 MG tablet  Commonly known as:  NORVASC  Take 1 tablet (10 mg total) by mouth daily.     aspirin EC 81 MG tablet  Take 81 mg by mouth daily.     cilostazol 100 MG tablet  Commonly known as:  PLETAL  Take 100 mg by mouth 2 (two) times daily.     clopidogrel 75 MG tablet  Commonly known as:  PLAVIX  Take 1 tablet (75 mg total) by mouth daily.     doxazosin 4 MG tablet  Commonly known as:   CARDURA  Take 4 mg by mouth daily.     hydrALAZINE 25 MG tablet  Commonly known as:  APRESOLINE  Take 1 tablet (25 mg total) by mouth every 8 (eight) hours.     metoprolol succinate 100 MG 24 hr tablet  Commonly known as:  TOPROL-XL  Take 1 tablet (100 mg total) by mouth 2 (two) times daily. Take with or immediately following a meal.     simvastatin 20 MG tablet  Commonly known as:  ZOCOR  Take 20 mg by mouth daily at 6 PM.     spironolactone 50 MG tablet  Commonly known as:  ALDACTONE  Take 1 tablet (50 mg total) by mouth every evening.           Follow-up Information   Follow up with Woody Seller Sylvan Cheese, MD. (Keep previous appointment)    Specialty:  Cardiology   Contact information:   [address] [place] [address] [phone]        [name], MD 09/09/2013, 9:51 AM  Pager: [phone] Office: [phone] If no answer: [phone]

## 2013-12-16 ENCOUNTER — Encounter (HOSPITAL_COMMUNITY): Payer: Self-pay | Admitting: Cardiology

## 2014-02-21 ENCOUNTER — Telehealth: Payer: Self-pay | Admitting: Internal Medicine

## 2014-02-21 ENCOUNTER — Encounter: Payer: Self-pay | Admitting: Internal Medicine

## 2014-02-21 ENCOUNTER — Ambulatory Visit (INDEPENDENT_AMBULATORY_CARE_PROVIDER_SITE_OTHER): Payer: Commercial Managed Care - HMO | Admitting: Internal Medicine

## 2014-02-21 VITALS — BP 152/84 | HR 76 | Temp 98.6°F | Ht 68.25 in | Wt 240.2 lb

## 2014-02-21 DIAGNOSIS — I739 Peripheral vascular disease, unspecified: Secondary | ICD-10-CM | POA: Diagnosis not present

## 2014-02-21 DIAGNOSIS — E785 Hyperlipidemia, unspecified: Secondary | ICD-10-CM | POA: Diagnosis not present

## 2014-02-21 DIAGNOSIS — N400 Enlarged prostate without lower urinary tract symptoms: Secondary | ICD-10-CM

## 2014-02-21 DIAGNOSIS — Z7189 Other specified counseling: Secondary | ICD-10-CM | POA: Diagnosis not present

## 2014-02-21 DIAGNOSIS — I10 Essential (primary) hypertension: Secondary | ICD-10-CM | POA: Diagnosis not present

## 2014-02-21 DIAGNOSIS — Z0000 Encounter for general adult medical examination without abnormal findings: Secondary | ICD-10-CM | POA: Diagnosis not present

## 2014-02-21 LAB — COMPREHENSIVE METABOLIC PANEL
ALK PHOS: 71 U/L (ref 39–117)
ALT: 27 U/L (ref 0–53)
AST: 19 U/L (ref 0–37)
Albumin: 3.8 g/dL (ref 3.5–5.2)
BUN: 14 mg/dL (ref 6–23)
CO2: 28 mEq/L (ref 19–32)
CREATININE: 0.84 mg/dL (ref 0.40–1.50)
Calcium: 9.3 mg/dL (ref 8.4–10.5)
Chloride: 106 mEq/L (ref 96–112)
GFR: 96.58 mL/min (ref >60.00)
Glucose, Bld: 108 mg/dL — ABNORMAL HIGH (ref 70–99)
POTASSIUM: 4 meq/L (ref 3.5–5.1)
Sodium: 139 mEq/L (ref 135–145)
Total Bilirubin: 0.5 mg/dL (ref 0.2–1.2)
Total Protein: 6.9 g/dL (ref 6.0–8.3)

## 2014-02-21 LAB — CBC WITH DIFFERENTIAL/PLATELET
BASOS ABS: 0.1 10*3/uL (ref 0.0–0.1)
Basophils Relative: 0.8 % (ref 0.0–3.0)
Eosinophils Absolute: 0.3 10*3/uL (ref 0.0–0.7)
Eosinophils Relative: 3.8 % (ref 0.0–5.0)
HEMATOCRIT: 39.2 % (ref 39.0–52.0)
HEMOGLOBIN: 13.2 g/dL (ref 13.0–17.0)
LYMPHS ABS: 1.8 10*3/uL (ref 0.7–4.0)
Lymphocytes Relative: 22.1 % (ref 12.0–46.0)
MCHC: 33.6 g/dL (ref 30.0–36.0)
MCV: 98.8 fl (ref 78.0–100.0)
MONO ABS: 0.4 10*3/uL (ref 0.1–1.0)
MONOS PCT: 5.1 % (ref 3.0–12.0)
Neutro Abs: 5.4 10*3/uL (ref 1.4–7.7)
Neutrophils Relative %: 68.2 % (ref 43.0–77.0)
Platelets: 214 10*3/uL (ref 150.0–400.0)
RBC: 3.97 Mil/uL — AB (ref 4.22–5.81)
RDW: 12.5 % (ref 11.5–15.5)
WBC: 8 10*3/uL (ref 4.0–10.5)

## 2014-02-21 LAB — LIPID PANEL
CHOL/HDL RATIO: 4
CHOLESTEROL: 167 mg/dL (ref 0–200)
HDL: 38.5 mg/dL — AB (ref >39.00)
LDL Cholesterol: 89 mg/dL (ref 0–99)
NonHDL: 128.5
Triglycerides: 196 mg/dL — ABNORMAL HIGH (ref 0.0–149.0)
VLDL: 39.2 mg/dL (ref 0.0–40.0)

## 2014-02-21 LAB — T4, FREE: FREE T4: 0.87 ng/dL (ref 0.60–1.60)

## 2014-02-21 NOTE — Assessment & Plan Note (Signed)
BP Readings from Last 3 Encounters:  02/21/14 152/84  09/09/13 132/71  08/20/13 140/70   Up a little No change for now

## 2014-02-21 NOTE — Telephone Encounter (Signed)
emmi mailed  °

## 2014-02-21 NOTE — Assessment & Plan Note (Signed)
No problems on statin Will check labs

## 2014-02-21 NOTE — Assessment & Plan Note (Signed)
I have personally reviewed the Medicare Annual Wellness questionnaire and have noted 1. The patient's medical and social history 2. Their use of alcohol, tobacco or illicit drugs 3. Their current medications and supplements 4. The patient's functional ability including ADL's, fall risks, home safety risks and hearing or visual             impairment. 5. Diet and physical activities 6. Evidence for depression or mood disorders  The patients weight, height, BMI and visual acuity have been recorded in the chart I have made referrals, counseling and provided education to the patient based review of the above and I have provided the pt with a written personalized care plan for preventive services.  I have provided you with a copy of your personalized plan for preventive services. Please take the time to review along with your updated medication list.  Doesn't want flu vaccine--urged him to take one for next season Too soon for prevnar--will do it next time Doesn't want PSA after discussion Will be due for colonoscopy in ~2 years---- will check his past records at [facility]

## 2014-02-21 NOTE — Telephone Encounter (Signed)
emmi emailed °

## 2014-02-21 NOTE — Assessment & Plan Note (Signed)
Trying to walk more Doesn't notice much difference with the angioplasty Discussed that he needs to stop all the cigarettes

## 2014-02-21 NOTE — Assessment & Plan Note (Signed)
See social history Blank forms given 

## 2014-02-21 NOTE — Progress Notes (Signed)
Colonoscopy was 10/06/06 Several tubular adenomas Will discuss with him ---should have already had follow up

## 2014-02-21 NOTE — Progress Notes (Signed)
Pre visit review using our clinic review tool, if applicable. No additional management support is needed unless otherwise documented below in the visit note. 

## 2014-02-21 NOTE — Assessment & Plan Note (Signed)
Sig symptoms He prefers no new meds Would add finasteride if he wants more Rx
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
## 2014-02-22 ENCOUNTER — Encounter: Payer: Self-pay | Admitting: *Deleted

## 2014-02-22 ENCOUNTER — Other Ambulatory Visit: Payer: Self-pay | Admitting: Internal Medicine

## 2014-02-22 DIAGNOSIS — D126 Benign neoplasm of colon, unspecified: Secondary | ICD-10-CM

## 2014-02-23 ENCOUNTER — Telehealth: Payer: Self-pay | Admitting: Internal Medicine

## 2014-02-23 NOTE — Telephone Encounter (Signed)
[employer] completed and referral request for Colonoscopy faxed to Dr Lynnell Jude office for them to call the patient to set up Colon, Patient aware that they will call the patient to schedule.

## 2014-02-23 NOTE — Telephone Encounter (Signed)
No, he would need a visit since I need to correlate his symptoms and exam to know what the urinalysis means. If he has no pain and the urine is just dark, he can just increase fluids for now and then make an appt if it continues

## 2014-02-23 NOTE — Telephone Encounter (Signed)
Spoke to [place] and informed her of Dr. Everardo Beals comments. She will tell pt to call 02/24/14 and schedule appt.

## 2014-02-23 NOTE — Telephone Encounter (Signed)
Pt stated he went to dr Allen Norris for his last colonoscopy. 8 years ago He tried to make an appointment with then they needed a referral [phone] perfers am appointment

## 2014-02-23 NOTE — Telephone Encounter (Signed)
Called Brian Clarke to get Colonoscopy set up and while talking to his wife, Brian Clarke she said Brian Clarke told her that his urine was looking dark like there might be blood in it? Wants to know if he can just come here to give you a specimen and not have to make an appt? Please advise and call them back on [phone].

## 2014-02-28 ENCOUNTER — Encounter: Payer: Self-pay | Admitting: Internal Medicine

## 2014-02-28 ENCOUNTER — Ambulatory Visit (INDEPENDENT_AMBULATORY_CARE_PROVIDER_SITE_OTHER): Payer: Commercial Managed Care - HMO | Admitting: Internal Medicine

## 2014-02-28 ENCOUNTER — Telehealth: Payer: Self-pay

## 2014-02-28 VITALS — BP 152/82 | HR 65 | Temp 98.2°F | Ht 68.25 in | Wt 244.8 lb

## 2014-02-28 DIAGNOSIS — R319 Hematuria, unspecified: Secondary | ICD-10-CM

## 2014-02-28 LAB — POCT URINALYSIS DIPSTICK
BILIRUBIN UA: NEGATIVE
Glucose, UA: NEGATIVE
Ketones, UA: NEGATIVE
Leukocytes, UA: NEGATIVE
Nitrite, UA: NEGATIVE
PH UA: 6
Protein, UA: NEGATIVE
Spec Grav, UA: 1.025
Urobilinogen, UA: NEGATIVE

## 2014-02-28 NOTE — Assessment & Plan Note (Signed)
Had one day of gross hematuria--now clear No pain to suggest stone Not sick so doesn't seem infection  Discussed stopping smoking Will refer to urology--will need cysto and probably CT scan

## 2014-02-28 NOTE — Progress Notes (Signed)
Pre visit review using our clinic review tool, if applicable. No additional management support is needed unless otherwise documented below in the visit note. 
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
## 2014-02-28 NOTE — Telephone Encounter (Signed)
Ms [facility] left v/m returning call; spoke with [facility] and she has already talked with Ms [facility].

## 2014-05-10 DIAGNOSIS — R312 Other microscopic hematuria: Secondary | ICD-10-CM | POA: Diagnosis not present

## 2014-05-10 DIAGNOSIS — R3912 Poor urinary stream: Secondary | ICD-10-CM | POA: Diagnosis not present

## 2014-05-10 DIAGNOSIS — R351 Nocturia: Secondary | ICD-10-CM | POA: Diagnosis not present

## 2014-05-23 DIAGNOSIS — F17209 Nicotine dependence, unspecified, with unspecified nicotine-induced disorders: Secondary | ICD-10-CM | POA: Diagnosis not present

## 2014-05-23 DIAGNOSIS — N2889 Other specified disorders of kidney and ureter: Secondary | ICD-10-CM | POA: Diagnosis not present

## 2014-05-23 DIAGNOSIS — R0989 Other specified symptoms and signs involving the circulatory and respiratory systems: Secondary | ICD-10-CM | POA: Diagnosis not present

## 2014-05-23 DIAGNOSIS — R312 Other microscopic hematuria: Secondary | ICD-10-CM | POA: Diagnosis not present

## 2014-05-23 DIAGNOSIS — N210 Calculus in bladder: Secondary | ICD-10-CM | POA: Diagnosis not present

## 2014-05-23 DIAGNOSIS — N200 Calculus of kidney: Secondary | ICD-10-CM | POA: Diagnosis not present

## 2014-05-23 DIAGNOSIS — E780 Pure hypercholesterolemia: Secondary | ICD-10-CM | POA: Diagnosis not present

## 2014-05-23 DIAGNOSIS — I739 Peripheral vascular disease, unspecified: Secondary | ICD-10-CM | POA: Diagnosis not present

## 2014-05-23 DIAGNOSIS — I10 Essential (primary) hypertension: Secondary | ICD-10-CM | POA: Diagnosis not present

## 2014-06-02 DIAGNOSIS — R312 Other microscopic hematuria: Secondary | ICD-10-CM | POA: Diagnosis not present

## 2014-06-08 DIAGNOSIS — I739 Peripheral vascular disease, unspecified: Secondary | ICD-10-CM | POA: Diagnosis not present

## 2014-06-08 HISTORY — PX: OTHER SURGICAL HISTORY: SHX169

## 2014-06-16 DIAGNOSIS — N281 Cyst of kidney, acquired: Secondary | ICD-10-CM | POA: Diagnosis not present

## 2014-06-16 DIAGNOSIS — R312 Other microscopic hematuria: Secondary | ICD-10-CM | POA: Diagnosis not present

## 2014-06-16 DIAGNOSIS — N200 Calculus of kidney: Secondary | ICD-10-CM | POA: Diagnosis not present

## 2014-06-16 DIAGNOSIS — R3912 Poor urinary stream: Secondary | ICD-10-CM | POA: Diagnosis not present

## 2014-06-17 ENCOUNTER — Other Ambulatory Visit: Payer: Self-pay | Admitting: Urology

## 2014-06-20 NOTE — Progress Notes (Signed)
Called for orders to be released in Epic to sign and held for surgery 07-01-14 pre op 06-28-14 Thanks

## 2014-06-23 DIAGNOSIS — R0989 Other specified symptoms and signs involving the circulatory and respiratory systems: Secondary | ICD-10-CM | POA: Diagnosis not present

## 2014-06-27 NOTE — Patient Instructions (Signed)
Brian Clarke [facility]  06/27/2014   Your procedure is scheduled on:     07/01/2014    Report to [facility] Main  Entrance and follow signs to               [place] at      0730 AM.  Call this number if you have problems the morning of surgery [phone]   Remember: ONLY 1 PERSON MAY GO WITH YOU TO SHORT STAY TO GET  READY MORNING OF [place].  Do not eat food or drink liquids :After Midnight.     Take these medicines the morning of surgery with A SIP OF WATER:   Cardura, metoprolol ( Toprol)                                You may not have any metal on your body including hair pins and              piercings  Do not wear jewelry, lotions, powders or perfumes, deodorant                        Men may shave face and neck.   Do not bring valuables to the hospital. [place].  Contacts, dentures or bridgework may not be worn into surgery.       Patients discharged the day of surgery will not be allowed to drive home.  Name and phone number of your driver:  Special Instructions:coughing and deep breathing exercises,leg exercises               Please read over the following fact sheets you were given: _____________________________________________________________________             [facility] - Preparing for Surgery Before surgery, you can play an important role.  Because skin is not sterile, your skin needs to be as free of germs as possible.  You can reduce the number of germs on your skin by washing with CHG (chlorahexidine gluconate) soap before surgery.  CHG is an antiseptic cleaner which kills germs and bonds with the skin to continue killing germs even after washing. Please DO NOT use if you have an allergy to CHG or antibacterial soaps.  If your skin becomes reddened/irritated stop using the CHG and inform your nurse when you arrive at Short Stay. Do not shave (including legs and underarms) for at  least 48 hours prior to the first CHG shower.  You may shave your face/neck. Please follow these instructions carefully:  1.  Shower with CHG Soap the night before surgery and the  morning of Surgery.  2.  If you choose to wash your hair, wash your hair first as usual with your  normal  shampoo.  3.  After you shampoo, rinse your hair and body thoroughly to remove the  shampoo.                           4.  Use CHG as you would any other liquid soap.  You can apply chg directly  to the skin and wash  Gently with a scrungie or clean washcloth.  5.  Apply the CHG Soap to your body ONLY FROM THE NECK DOWN.   Do not use on face/ open                           Wound or open sores. Avoid contact with eyes, ears mouth and genitals (private parts).                       Wash face,  Genitals (private parts) with your normal soap.             6.  Wash thoroughly, paying special attention to the area where your surgery  will be performed.  7.  Thoroughly rinse your body with warm water from the neck down.  8.  DO NOT shower/wash with your normal soap after using and rinsing off  the CHG Soap.                9.  Pat yourself dry with a clean towel.            10.  Wear clean pajamas.            11.  Place clean sheets on your bed the night of your first shower and do not  sleep with pets. Day of Surgery : Do not apply any lotions/deodorants the morning of surgery.  Please wear clean clothes to the hospital/surgery center.  FAILURE TO FOLLOW THESE INSTRUCTIONS MAY RESULT IN THE CANCELLATION OF YOUR SURGERY PATIENT SIGNATURE_________________________________  NURSE SIGNATURE__________________________________  ________________________________________________________________________

## 2014-06-28 ENCOUNTER — Encounter (HOSPITAL_COMMUNITY)
Admission: RE | Admit: 2014-06-28 | Discharge: 2014-06-28 | Disposition: A | Discharge status: Home / Self Care | Payer: Commercial Managed Care - HMO | Source: Ambulatory Visit | Attending: Urology | Admitting: Urology

## 2014-06-28 ENCOUNTER — Encounter (HOSPITAL_COMMUNITY): Payer: Self-pay

## 2014-06-28 DIAGNOSIS — N281 Cyst of kidney, acquired: Secondary | ICD-10-CM | POA: Diagnosis not present

## 2014-06-28 DIAGNOSIS — R310 Gross hematuria: Secondary | ICD-10-CM | POA: Diagnosis present

## 2014-06-28 DIAGNOSIS — N200 Calculus of kidney: Secondary | ICD-10-CM | POA: Diagnosis not present

## 2014-06-28 DIAGNOSIS — M1990 Unspecified osteoarthritis, unspecified site: Secondary | ICD-10-CM | POA: Diagnosis not present

## 2014-06-28 DIAGNOSIS — I10 Essential (primary) hypertension: Secondary | ICD-10-CM | POA: Diagnosis not present

## 2014-06-28 DIAGNOSIS — M479 Spondylosis, unspecified: Secondary | ICD-10-CM | POA: Diagnosis not present

## 2014-06-28 DIAGNOSIS — Z7902 Long term (current) use of antithrombotics/antiplatelets: Secondary | ICD-10-CM | POA: Diagnosis not present

## 2014-06-28 DIAGNOSIS — N400 Enlarged prostate without lower urinary tract symptoms: Secondary | ICD-10-CM | POA: Diagnosis not present

## 2014-06-28 DIAGNOSIS — Z79899 Other long term (current) drug therapy: Secondary | ICD-10-CM | POA: Diagnosis not present

## 2014-06-28 DIAGNOSIS — I739 Peripheral vascular disease, unspecified: Secondary | ICD-10-CM | POA: Diagnosis not present

## 2014-06-28 DIAGNOSIS — E785 Hyperlipidemia, unspecified: Secondary | ICD-10-CM | POA: Diagnosis not present

## 2014-06-28 DIAGNOSIS — F17210 Nicotine dependence, cigarettes, uncomplicated: Secondary | ICD-10-CM | POA: Diagnosis not present

## 2014-06-28 HISTORY — DX: Unspecified osteoarthritis, unspecified site: M19.90

## 2014-06-28 LAB — COMPREHENSIVE METABOLIC PANEL
ALBUMIN: 3.9 g/dL (ref 3.5–5.0)
ALK PHOS: 74 U/L (ref 38–126)
ALT: 20 U/L (ref 17–63)
AST: 19 U/L (ref 15–41)
Anion gap: 8 (ref 5–15)
BUN: 16 mg/dL (ref 6–20)
CHLORIDE: 108 mmol/L (ref 101–111)
CO2: 28 mmol/L (ref 22–32)
Calcium: 9.6 mg/dL (ref 8.9–10.3)
Creatinine, Ser: 0.86 mg/dL (ref 0.61–1.24)
GFR calc Af Amer: >60 mL/min (ref >60)
GFR calc non Af Amer: >60 mL/min (ref >60)
Glucose, Bld: 102 mg/dL — ABNORMAL HIGH (ref 65–99)
POTASSIUM: 5 mmol/L (ref 3.5–5.1)
SODIUM: 144 mmol/L (ref 135–145)
Total Bilirubin: 0.9 mg/dL (ref 0.3–1.2)
Total Protein: 7.1 g/dL (ref 6.5–8.1)

## 2014-06-28 LAB — CBC
HCT: 39.5 % (ref 39.0–52.0)
HEMOGLOBIN: 13 g/dL (ref 13.0–17.0)
MCH: 33.3 pg (ref 26.0–34.0)
MCHC: 32.9 g/dL (ref 30.0–36.0)
MCV: 101.3 fL — AB (ref 78.0–100.0)
Platelets: 204 10*3/uL (ref 150–400)
RBC: 3.9 MIL/uL — ABNORMAL LOW (ref 4.22–5.81)
RDW: 12.4 % (ref 11.5–15.5)
WBC: 6.8 10*3/uL (ref 4.0–10.5)

## 2014-06-28 NOTE — Progress Notes (Signed)
EKG- 09/07/13 EPIC , EKG- 05/23/14 on chart  02/28/14- LOV with PCP- Dr Silvio Pate [facility]  05/23/14- LOV with DR Einar Gip on chart 09/07/2013- angiogram on chart  06/08/2014 Lower extremity arterial study on chart

## 2014-06-30 DIAGNOSIS — E780 Pure hypercholesterolemia: Secondary | ICD-10-CM | POA: Diagnosis not present

## 2014-06-30 DIAGNOSIS — I10 Essential (primary) hypertension: Secondary | ICD-10-CM | POA: Diagnosis not present

## 2014-06-30 DIAGNOSIS — F17209 Nicotine dependence, unspecified, with unspecified nicotine-induced disorders: Secondary | ICD-10-CM | POA: Diagnosis not present

## 2014-06-30 DIAGNOSIS — I739 Peripheral vascular disease, unspecified: Secondary | ICD-10-CM | POA: Diagnosis not present

## 2014-06-30 MED ORDER — GENTAMICIN SULFATE 40 MG/ML IJ SOLN
5.0000 mg/kg | INTRAVENOUS | Status: AC
  Administered 2014-07-01: 420 mg via INTRAVENOUS
  Filled 2014-06-30: qty 10.5

## 2014-07-01 ENCOUNTER — Ambulatory Visit (HOSPITAL_COMMUNITY)
Admission: RE | Admit: 2014-07-01 | Discharge: 2014-07-01 | Disposition: A | Discharge status: Home / Self Care | Payer: Commercial Managed Care - HMO | Source: Ambulatory Visit | Attending: Urology | Admitting: Urology

## 2014-07-01 ENCOUNTER — Ambulatory Visit (HOSPITAL_COMMUNITY): Payer: Commercial Managed Care - HMO | Admitting: Certified Registered Nurse Anesthetist

## 2014-07-01 ENCOUNTER — Encounter (HOSPITAL_COMMUNITY): Payer: Self-pay | Admitting: *Deleted

## 2014-07-01 ENCOUNTER — Encounter (HOSPITAL_COMMUNITY)
Admission: RE | Disposition: A | Discharge status: Home / Self Care | Payer: Self-pay | Source: Ambulatory Visit | Attending: Urology

## 2014-07-01 DIAGNOSIS — I739 Peripheral vascular disease, unspecified: Secondary | ICD-10-CM | POA: Diagnosis not present

## 2014-07-01 DIAGNOSIS — Z7902 Long term (current) use of antithrombotics/antiplatelets: Secondary | ICD-10-CM | POA: Diagnosis not present

## 2014-07-01 DIAGNOSIS — Z79899 Other long term (current) drug therapy: Secondary | ICD-10-CM | POA: Insufficient documentation

## 2014-07-01 DIAGNOSIS — F17210 Nicotine dependence, cigarettes, uncomplicated: Secondary | ICD-10-CM | POA: Diagnosis not present

## 2014-07-01 DIAGNOSIS — I10 Essential (primary) hypertension: Secondary | ICD-10-CM | POA: Diagnosis not present

## 2014-07-01 DIAGNOSIS — N281 Cyst of kidney, acquired: Secondary | ICD-10-CM | POA: Insufficient documentation

## 2014-07-01 DIAGNOSIS — R319 Hematuria, unspecified: Secondary | ICD-10-CM | POA: Diagnosis not present

## 2014-07-01 DIAGNOSIS — E785 Hyperlipidemia, unspecified: Secondary | ICD-10-CM | POA: Insufficient documentation

## 2014-07-01 DIAGNOSIS — M479 Spondylosis, unspecified: Secondary | ICD-10-CM | POA: Insufficient documentation

## 2014-07-01 DIAGNOSIS — N400 Enlarged prostate without lower urinary tract symptoms: Secondary | ICD-10-CM | POA: Insufficient documentation

## 2014-07-01 DIAGNOSIS — M1990 Unspecified osteoarthritis, unspecified site: Secondary | ICD-10-CM | POA: Insufficient documentation

## 2014-07-01 DIAGNOSIS — N200 Calculus of kidney: Secondary | ICD-10-CM | POA: Insufficient documentation

## 2014-07-01 HISTORY — PX: CYSTOSCOPY WITH RETROGRADE PYELOGRAM, URETEROSCOPY AND STENT PLACEMENT: SHX5789

## 2014-07-01 HISTORY — PX: HOLMIUM LASER APPLICATION: SHX5852

## 2014-07-01 SURGERY — CYSTOURETEROSCOPY, WITH RETROGRADE PYELOGRAM AND STENT INSERTION
Anesthesia: General | Laterality: Left

## 2014-07-01 MED ORDER — ONDANSETRON HCL 4 MG/2ML IJ SOLN
INTRAMUSCULAR | Status: AC
  Filled 2014-07-01: qty 2

## 2014-07-01 MED ORDER — PROPOFOL 10 MG/ML IV BOLUS
INTRAVENOUS | Status: DC | PRN
  Administered 2014-07-01: 200 mg via INTRAVENOUS

## 2014-07-01 MED ORDER — LIDOCAINE HCL (CARDIAC) 20 MG/ML IV SOLN
INTRAVENOUS | Status: AC
  Filled 2014-07-01: qty 5

## 2014-07-01 MED ORDER — FENTANYL CITRATE (PF) 100 MCG/2ML IJ SOLN: 25.0000 ug | INTRAMUSCULAR | Status: DC | PRN

## 2014-07-01 MED ORDER — PROMETHAZINE HCL 25 MG/ML IJ SOLN
6.2500 mg | INTRAMUSCULAR | Status: DC | PRN
Start: 2014-07-01 — End: 2014-07-01

## 2014-07-01 MED ORDER — SODIUM CHLORIDE 0.9 % IR SOLN
Status: DC | PRN
  Administered 2014-07-01: 4000 mL

## 2014-07-01 MED ORDER — EPHEDRINE SULFATE 50 MG/ML IJ SOLN
INTRAMUSCULAR | Status: DC | PRN
  Administered 2014-07-01 (×2): 10 mg via INTRAVENOUS

## 2014-07-01 MED ORDER — FENTANYL CITRATE (PF) 100 MCG/2ML IJ SOLN
INTRAMUSCULAR | Status: AC
  Filled 2014-07-01: qty 2

## 2014-07-01 MED ORDER — GLYCOPYRROLATE 0.2 MG/ML IJ SOLN
INTRAMUSCULAR | Status: AC
  Filled 2014-07-01: qty 1

## 2014-07-01 MED ORDER — IOHEXOL 300 MG/ML  SOLN
INTRAMUSCULAR | Status: DC | PRN
  Administered 2014-07-01: 30 mg

## 2014-07-01 MED ORDER — MIDAZOLAM HCL 2 MG/2ML IJ SOLN
INTRAMUSCULAR | Status: AC
  Filled 2014-07-01: qty 2

## 2014-07-01 MED ORDER — 0.9 % SODIUM CHLORIDE (POUR BTL) OPTIME
TOPICAL | Status: DC | PRN
  Administered 2014-07-01: 1000 mL

## 2014-07-01 MED ORDER — MEPERIDINE HCL 50 MG/ML IJ SOLN: 6.2500 mg | INTRAMUSCULAR | Status: DC | PRN

## 2014-07-01 MED ORDER — OXYCODONE-ACETAMINOPHEN 5-325 MG PO TABS: 1.0000 | ORAL_TABLET | Freq: Four times a day (QID) | ORAL | Status: DC | PRN

## 2014-07-01 MED ORDER — CEPHALEXIN 500 MG PO CAPS: 500.0000 mg | ORAL_CAPSULE | Freq: Two times a day (BID) | ORAL | Status: DC

## 2014-07-01 MED ORDER — GLYCOPYRROLATE 0.2 MG/ML IJ SOLN
INTRAMUSCULAR | Status: DC | PRN
  Administered 2014-07-01: 0.2 mg via INTRAVENOUS

## 2014-07-01 MED ORDER — LACTATED RINGERS IV SOLN
INTRAVENOUS | Status: DC | PRN
  Administered 2014-07-01 (×2): via INTRAVENOUS

## 2014-07-01 MED ORDER — SENNOSIDES-DOCUSATE SODIUM 8.6-50 MG PO TABS
1.0000 | ORAL_TABLET | Freq: Two times a day (BID) | ORAL | Status: DC
Start: 2014-07-01 — End: 2014-12-13

## 2014-07-01 MED ORDER — FENTANYL CITRATE (PF) 100 MCG/2ML IJ SOLN
INTRAMUSCULAR | Status: DC | PRN
  Administered 2014-07-01 (×2): 50 ug via INTRAVENOUS

## 2014-07-01 MED ORDER — EPHEDRINE SULFATE 50 MG/ML IJ SOLN
INTRAMUSCULAR | Status: AC
  Filled 2014-07-01: qty 1

## 2014-07-01 MED ORDER — MIDAZOLAM HCL 5 MG/5ML IJ SOLN
INTRAMUSCULAR | Status: DC | PRN
  Administered 2014-07-01: 2 mg via INTRAVENOUS

## 2014-07-01 MED ORDER — LACTATED RINGERS IV SOLN: INTRAVENOUS | Status: DC

## 2014-07-01 MED ORDER — SODIUM CHLORIDE 0.9 % IJ SOLN
INTRAMUSCULAR | Status: AC
  Filled 2014-07-01: qty 10

## 2014-07-01 MED ORDER — LIDOCAINE HCL (CARDIAC) 20 MG/ML IV SOLN
INTRAVENOUS | Status: DC | PRN
  Administered 2014-07-01: 50 mg via INTRAVENOUS

## 2014-07-01 MED ORDER — PROPOFOL 10 MG/ML IV BOLUS
INTRAVENOUS | Status: AC
  Filled 2014-07-01: qty 20

## 2014-07-01 SURGICAL SUPPLY — 26 items
BASKET LASER NITINOL 1.9FR (BASKET) ×2 IMPLANT
BASKET STNLS GEMINI 4WIRE 3FR (BASKET) IMPLANT
BASKET ZERO TIP NITINOL 2.4FR (BASKET) IMPLANT
BSKT STON RTRVL 120 1.9FR (BASKET) ×1
BSKT STON RTRVL GEM 120X11 3FR (BASKET)
BSKT STON RTRVL ZERO TP 2.4FR (BASKET)
CATH INTERMIT  6FR 70CM (CATHETERS) ×1 IMPLANT
CLOTH BEACON ORANGE TIMEOUT ST (SAFETY) ×3 IMPLANT
ELECT REM PT RETURN 9FT ADLT (ELECTROSURGICAL)
ELECTRODE REM PT RTRN 9FT ADLT (ELECTROSURGICAL) IMPLANT
FIBER LASER FLEXIVA 1000 (UROLOGICAL SUPPLIES) IMPLANT
FIBER LASER FLEXIVA 200 (UROLOGICAL SUPPLIES) ×2 IMPLANT
FIBER LASER FLEXIVA 365 (UROLOGICAL SUPPLIES) IMPLANT
FIBER LASER FLEXIVA 550 (UROLOGICAL SUPPLIES) IMPLANT
FIBER LASER TRAC TIP (UROLOGICAL SUPPLIES) ×2 IMPLANT
GLOVE BIOGEL M STRL SZ7.5 (GLOVE) ×3 IMPLANT
GOWN STRL REUS W/TWL XL LVL3 (GOWN DISPOSABLE) ×6 IMPLANT
GUIDEWIRE ANG ZIPWIRE 038X150 (WIRE) ×3 IMPLANT
GUIDEWIRE STR DUAL SENSOR (WIRE) ×3 IMPLANT
IV NS IRRIG 3000ML ARTHROMATIC (IV SOLUTION) ×3 IMPLANT
PACK CYSTO (CUSTOM PROCEDURE TRAY) ×3 IMPLANT
SHEATH ACCESS URETERAL 38CM (SHEATH) ×2 IMPLANT
STENT POLARIS 5FRX26 (STENTS) ×2 IMPLANT
SYRINGE 10CC LL (SYRINGE) IMPLANT
SYRINGE IRR TOOMEY STRL 70CC (SYRINGE) IMPLANT
TUBE FEEDING 8FR 16IN STR KANG (MISCELLANEOUS) ×3 IMPLANT

## 2014-07-01 NOTE — Op Note (Signed)
NAMEKALEN, RATAJCZAK                ACCOUNT NO.:  [id number]  MEDICAL RECORD NO.:  [id number]  LOCATION:  [facility]                         FACILITY:  [facility]  PHYSICIAN:  Alexis Frock, MD     DATE OF BIRTH:  July 30, 1945  DATE OF PROCEDURE:   07/01/2014                                OPERATIVE REPORT   DIAGNOSES:  Hematuria, left renal stone, left renal pelvis thickening.  PROCEDURES: 1. Cystoscopy with bilateral retrograde pyelogram and interpretation. 2. Left ureteroscopy with laser lithotripsy. 3. Insertion of left ureteral stent 5 x 24, with tether.  FINDINGS: 1. Unremarkable urinary bladder. 2. Unremarkable right retrograde pyelogram. 3. Radiodensity in the left lower pole consistent with known stone,     otherwise unremarkable left retrograde pyelogram. 4. No evidence of urothelial lesions in left renal pelvis. 5. Complete resolution of all stone fragments larger than 1/3rd mm     following laser lithotripsy and basket extraction on the left.  INDICATION:  Mr. Nhan is a pleasant 69 year old gentleman with extensive smoking history, who did have new-onset gross hematuria recently.  He underwent imaging with CT scan, which revealed a left lower pole nonobstructing renal stone and some impressive thickening of the left renal pelvis.  This was somewhat concerning for possible left upper tract urothelial carcinoma to better evaluate his left renal pelvis, rule out any urothelial lesions and treat his ascending stone. Options were discussed including operative ureteroscopy with laser lithotripsy, possible biopsy for recent lesions were found and he wished to proceed.  Informed consent was obtained and placed in the medical record.  He has been off his Plavix for 5 days.  PROCEDURE IN DETAIL:  The patient being [facility], was verified. Procedure being cysto with retrogrades and left ureteroscopy, and stone manipulation versus biopsy were confirmed.  Procedure was carried  out. Time-out was performed.  Intravenous antibiotics were administered. General LMA anesthesia was introduced.  The patient was placed into a low lithotomy position and sterile field was created by prepping and draping the patient's penis, perineum, and proximal thighs using iodine x3.  Next, cystourethroscopy was performed using a 23-French rigid cystoscope with 30-degree offset lens.  Inspection of the anterior and posterior urethra were unremarkable.  Inspection of the urinary bladder revealed no diverticula, calcifications, papular lesions.  Ureteral orifices were reported to be in their normal anatomic position and appears [place].  The right ureteral orifice was cannulated with a 6- French end-hole catheter and right retrograde pyelogram was obtained.  Right retrograde pyelogram demonstrated a single right ureter with single-system right kidney.  No filling defects or narrowing noted. Similarly, a left retrograde pyelogram was obtained.  Left retrograde pyelogram demonstrated a single left ureter with single- system left kidney.  There was a radiodensity in the extreme lower pole consistent with known stone.  There were no obvious filling defects.  A 0.038 Zip wire was advanced at the level of the upper pole and set aside as a safety wire.  An 8-French feeding tube was placed in the urinary bladder for pressure release.  Next, semi-rigid ureteroscopy was performed of the entire length of the left ureter alongside a separate Sensor working wire.  No  mucosal abnormalities were found.  The semi- rigid scope was exchanged for a 12/14, 38-cm ureteral access sheath at the level of proximal ureter.  Next, flexible digital ureteroscopy was performed using a single channel ureteroscope.  Inspection of all calices x2 revealed no worrisome urothelial lesions whatsoever.  There were multifocal calcifications mostly in the mid and lower pole, several of these were papillary tip.  There was  also dominant, large free- floating lower pole stone as anticipated.  The dominant stone appeared to be much too large for simple basketing.  As such, holmium laser energy was applied to the stone using settings of 0.4 joules and 20 Hz fragmenting the stone into pieces, approximately 1-2 mm.  These were then sequentially grasped with an Escape basket and brought out in their entirety and set aside for composition analysis.  Additionally, laser lithotripsy was applied to the papillary tip calcifications completely ablating the lower pole calyx where the larger stone had been present, there were multiple submillimeter calcifications that were too small for simple basketing.  As such, a BloodMop technique was used, in which, 3 mL of autologous blood was injected via the ureteroscope into this lower pole calyx, allowed to coagulate for 3 minutes and then removed as a mixture of clot and stone fragments, this removed approximately 80% of the free small residual stone burden.  Following these maneuvers, there was complete resolution of all stone fragments larger than 1/3rd mm. There was excellent hemostasis, no evidence of perforation.  The sheath was removed under continuous ureteroscopic vision, no mucosal abnormalities were found.  Given the use of the sheath, today, it was felt that interval ureteral stenting would be warranted.  As such, a new 5 x 26 Polaris-type stent was placed over the remaining safety wire using fluoroscopic guidance.  Good proximal and distal deployment were noted.  Tether was left in place and fashioned to the dorsum of the penis.  The bladder feeding tube was removed.  Procedure was then terminated.  The patient tolerated the procedure well.  There were no immediate periprocedural complications.  The patient was taken to the postanesthesia care unit in stable condition.          ______________________________ Alexis Frock, MD     TM/MEDQ  D:  07/01/2014   T:  07/01/2014  Job:  [id number]
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
## 2014-07-01 NOTE — Discharge Instructions (Signed)
1 - You may have urinary urgency (bladder spasms) and bloody urine on / off with stent in place. This is normal.  2 - Call MD or go to ER for fever >102, severe pain / nausea / vomiting not relieved by medications, or acute change in medical status  3 - Remove tethered stent on Monday morning at home by pulling on string, then blue/white plastic tubing, and discarding. Dr. Tresa Moore is in the office Monday if any issues arise.  4-  Restart plavix on Monday  07/04/14  General Anesthesia, Care After Refer to this sheet in the next few weeks. These instructions provide you with information on caring for yourself after your procedure. Your health care provider may also give you more specific instructions. Your treatment has been planned according to current medical practices, but problems sometimes occur. Call your health care provider if you have any problems or questions after your procedure. WHAT TO EXPECT AFTER THE PROCEDURE After the procedure, it is typical to experience:  Sleepiness.  Nausea and vomiting. HOME CARE INSTRUCTIONS  For the first 24 hours after general anesthesia:  Have a responsible person with you.  Do not drive a car. If you are alone, do not take public transportation.  Do not drink alcohol.  Do not take medicine that has not been prescribed by your health care provider.  Do not sign important papers or make important decisions.  You may resume a normal diet and activities as directed by your health care provider.  Change bandages (dressings) as directed.  If you have questions or problems that seem related to general anesthesia, call the hospital and ask for the anesthetist or anesthesiologist on call. SEEK MEDICAL CARE IF:  You have nausea and vomiting that continue the day after anesthesia.  You develop a rash. SEEK IMMEDIATE MEDICAL CARE IF:   You have difficulty breathing.  You have chest pain.  You have any allergic problems. Document Released:  04/01/2000 Document Revised: 12/29/2012 Document Reviewed: 07/09/2012 [facility] Patient Information 2015 [place], [place]. This information is not intended to replace advice given to you by your health care provider. Make sure you discuss any questions you have with your health care provider.    Ureteral Stent Implantation, Care After Refer to this sheet in the next few weeks. These instructions provide you with information on caring for yourself after your procedure. Your health care provider may also give you more specific instructions. Your treatment has been planned according to current medical practices, but problems sometimes occur. Call your health care provider if you have any problems or questions after your procedure. WHAT TO EXPECT AFTER THE PROCEDURE You should be back to normal activity within 48 hours after the procedure. Nausea and vomiting may occur and are commonly the result of anesthesia. It is common to experience sharp pain in the back or lower abdomen and penis with voiding. This is caused by movement of the ends of the stent with the act of urinating.It usually goes away within minutes after you have stopped urinating. HOME CARE INSTRUCTIONS Make sure to drink plenty of fluids. You may have small amounts of bleeding, causing your urine to be red. This is normal. Certain movements may trigger pain or a feeling that you need to urinate. You may be given medicines to prevent infection or bladder spasms. Be sure to take all medicines as directed. Only take over-the-counter or prescription medicines for pain, discomfort, or fever as directed by your health care provider. Do not take  aspirin, as this can make bleeding worse. Your stent will be left in until the blockage is resolved. This may take 2 weeks or longer, depending on the reason for stent implantation. You may have an X-ray exam to make sure your ureter is open and that the stent has not moved out of position (migrated). The  stent can be removed by your health care provider in the office. Medicines may be given for comfort while the stent is being removed. Be sure to keep all follow-up appointments so your health care provider can check that you are healing properly. SEEK MEDICAL CARE IF:  You experience increasing pain.  Your pain medicine is not working. SEEK IMMEDIATE MEDICAL CARE IF:  Your urine is dark red or has blood clots.  You are leaking urine (incontinent).  You have a fever, chills, feeling sick to your stomach (nausea), or vomiting.  Your pain is not relieved by pain medicine.  The end of the stent comes out of the urethra.  You are unable to urinate. Document Released: 08/26/2012 Document Revised: 12/29/2012 Document Reviewed: 08/26/2012 [facility] Patient Information 2015 [place], [place]. This information is not intended to replace advice given to you by your health care provider. Make sure you discuss any questions you have with your health care provider.

## 2014-07-01 NOTE — Transfer of Care (Signed)
Immediate Anesthesia Transfer of Care Note  Patient: Brian Clarke  Procedure(s) Performed: Procedure(s): CYSTOSCOPY WITH bilateral RETROGRADE PYELOGRAM,  left URETEROSCOPY, left STENT PLACEMENT AND POSSIBLE BIOPSY  (Left) HOLMIUM LASER APPLICATION (Left)  Patient Location: PACU  Anesthesia Type:General  Level of Consciousness: awake, alert  and oriented  Airway & Oxygen Therapy: Patient Spontanous Breathing and Patient connected to face mask oxygen  Post-op Assessment: Report given to RN and Post -op Vital signs reviewed and stable  Post vital signs: Reviewed and stable  Last Vitals:  Filed Vitals:   07/01/14 0732  BP: 172/72  Pulse: 67  Temp: 36.8 C  Resp: 16    Complications: No apparent anesthesia complications

## 2014-07-01 NOTE — Anesthesia Procedure Notes (Signed)
Procedure Name: LMA Insertion Date/Time: 07/01/2014 9:28 AM Performed by: Dimas Millin, Rondey Fallen F Pre-anesthesia Checklist: Patient identified, Emergency Drugs available, Suction available, Patient being monitored and Timeout performed Patient Re-evaluated:Patient Re-evaluated prior to inductionOxygen Delivery Method: Circle system utilized Preoxygenation: Pre-oxygenation with 100% oxygen Intubation Type: IV induction Ventilation: Mask ventilation without difficulty LMA: LMA inserted LMA Size: 4.0 Number of attempts: 1 Tube secured with: Tape Dental Injury: Teeth and Oropharynx as per pre-operative assessment

## 2014-07-01 NOTE — Brief Op Note (Signed)
07/01/2014  10:46 AM  PATIENT:  Brian Clarke  69 y.o. male  PRE-OPERATIVE DIAGNOSIS:  HEMATURIA, LEFT KIDNEY STONE / QUESTIONABLE MASS  POST-OPERATIVE DIAGNOSIS:  hematuria left kidney stone questionable mass  PROCEDURE:  Procedure(s): CYSTOSCOPY WITH bilateral RETROGRADE PYELOGRAM,  left URETEROSCOPY, left STENT PLACEMENT AND POSSIBLE BIOPSY  (Left) HOLMIUM LASER APPLICATION (Left)  SURGEON:  Surgeon(s) and Role:    * Alexis Frock, MD - Primary  PHYSICIAN ASSISTANT:   ASSISTANTS: none   ANESTHESIA:   general  EBL:  Total I/O In: 1000 [I.V.:1000] Out: -   BLOOD ADMINISTERED:none  DRAINS: none   LOCAL MEDICATIONS USED:  NONE  SPECIMEN:  Source of Specimen:  left renal stone fragments  DISPOSITION OF SPECIMEN:  [facility] for compositional analysis  COUNTS:  YES  TOURNIQUET:  * No tourniquets in log *  DICTATION: .Other Dictation: Dictation Number [id number]  PLAN OF CARE: Discharge to home after PACU  PATIENT DISPOSITION:  PACU - hemodynamically stable.   Delay start of Pharmacological VTE agent (>24hrs) due to surgical blood loss or risk of bleeding: yes

## 2014-07-01 NOTE — Anesthesia Preprocedure Evaluation (Signed)
Anesthesia Evaluation  Patient identified by MRN, date of birth, ID band Patient awake    Reviewed: Allergy & Precautions, NPO status , Patient's Chart, lab work & pertinent test results  Airway Mallampati: II  TM Distance: >3 FB Neck ROM: Full    Dental no notable dental hx. (+) Poor Dentition   Pulmonary Current Smoker,  breath sounds clear to auscultation  Pulmonary exam normal       Cardiovascular hypertension, Pt. on medications + Peripheral Vascular Disease Normal cardiovascular examRhythm:Regular Rate:Normal     Neuro/Psych negative neurological ROS  negative psych ROS   GI/Hepatic negative GI ROS, Neg liver ROS,   Endo/Other  negative endocrine ROS  Renal/GU negative Renal ROS  negative genitourinary   Musculoskeletal negative musculoskeletal ROS (+)   Abdominal   Peds negative pediatric ROS (+)  Hematology negative hematology ROS (+)   Anesthesia Other Findings   Reproductive/Obstetrics negative OB ROS                             Anesthesia Physical Anesthesia Plan  ASA: III  Anesthesia Plan: General   Post-op Pain Management:    Induction: Intravenous  Airway Management Planned: LMA and Oral ETT  Additional Equipment:   Intra-op Plan:   Post-operative Plan: Extubation in OR  Informed Consent: I have reviewed the patients History and Physical, chart, labs and discussed the procedure including the risks, benefits and alternatives for the proposed anesthesia with the patient or authorized representative who has indicated his/her understanding and acceptance.   Dental advisory given  Plan Discussed with: CRNA  Anesthesia Plan Comments:         Anesthesia Quick Evaluation

## 2014-07-02 NOTE — Anesthesia Postprocedure Evaluation (Signed)
  Anesthesia Post-op Note  Patient: Foy Vanduyne [facility]  Procedure(s) Performed: Procedure(s) (LRB): CYSTOSCOPY WITH bilateral RETROGRADE PYELOGRAM,  left URETEROSCOPY, left STENT PLACEMENT AND POSSIBLE BIOPSY  (Left) HOLMIUM LASER APPLICATION (Left)  Patient Location: PACU  Anesthesia Type: General  Level of Consciousness: awake and alert   Airway and Oxygen Therapy: Patient Spontanous Breathing  Post-op Pain: mild  Post-op Assessment: Post-op Vital signs reviewed, Patient's Cardiovascular Status Stable, Respiratory Function Stable, Patent Airway and No signs of Nausea or vomiting  Last Vitals:  Filed Vitals:   07/01/14 1217  BP: 145/65  Pulse:   Temp:   Resp: 16    Post-op Vital Signs: stable   Complications: No apparent anesthesia complications

## 2014-07-04 ENCOUNTER — Encounter (HOSPITAL_COMMUNITY): Payer: Self-pay | Admitting: Urology

## 2014-07-04 DIAGNOSIS — N200 Calculus of kidney: Secondary | ICD-10-CM | POA: Diagnosis not present

## 2014-07-14 DIAGNOSIS — R312 Other microscopic hematuria: Secondary | ICD-10-CM | POA: Diagnosis not present

## 2014-07-14 DIAGNOSIS — N281 Cyst of kidney, acquired: Secondary | ICD-10-CM | POA: Diagnosis not present

## 2014-07-14 DIAGNOSIS — N200 Calculus of kidney: Secondary | ICD-10-CM | POA: Diagnosis not present

## 2014-12-13 ENCOUNTER — Ambulatory Visit (INDEPENDENT_AMBULATORY_CARE_PROVIDER_SITE_OTHER): Payer: Commercial Managed Care - HMO | Admitting: Internal Medicine

## 2014-12-13 ENCOUNTER — Encounter: Payer: Self-pay | Admitting: Internal Medicine

## 2014-12-13 VITALS — BP 138/70 | HR 82 | Temp 97.9°F | Wt 243.0 lb

## 2014-12-13 DIAGNOSIS — H6506 Acute serous otitis media, recurrent, bilateral: Secondary | ICD-10-CM

## 2014-12-13 DIAGNOSIS — H6593 Unspecified nonsuppurative otitis media, bilateral: Secondary | ICD-10-CM | POA: Insufficient documentation

## 2014-12-13 MED ORDER — AMOXICILLIN 500 MG PO TABS: 1000.0000 mg | ORAL_TABLET | Freq: Two times a day (BID) | ORAL | Status: DC

## 2014-12-13 NOTE — Assessment & Plan Note (Signed)
Ongoing symptoms for weeks Will try amoxil If continues, will set up with [facility] ENT

## 2014-12-13 NOTE — Patient Instructions (Signed)
Let me know if your ears aren't better within 2 weeks or so. I will set you up with an ENT doctor.
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
## 2014-12-13 NOTE — Progress Notes (Signed)
Pre visit review using our clinic review tool, if applicable. No additional management support is needed unless otherwise documented below in the visit note. 

## 2014-12-27 DIAGNOSIS — I6523 Occlusion and stenosis of bilateral carotid arteries: Secondary | ICD-10-CM | POA: Diagnosis not present

## 2015-01-03 ENCOUNTER — Telehealth: Payer: Self-pay | Admitting: Internal Medicine

## 2015-01-03 ENCOUNTER — Encounter: Payer: Self-pay | Admitting: Internal Medicine

## 2015-01-03 DIAGNOSIS — H6503 Acute serous otitis media, bilateral: Secondary | ICD-10-CM

## 2015-01-03 DIAGNOSIS — I6523 Occlusion and stenosis of bilateral carotid arteries: Secondary | ICD-10-CM | POA: Insufficient documentation

## 2015-01-03 DIAGNOSIS — I739 Peripheral vascular disease, unspecified: Secondary | ICD-10-CM

## 2015-01-03 NOTE — Telephone Encounter (Signed)
Patient was seen 3 weeks ago by [facility] for his ears.  Patient was given medication and told if he wasn't feeling better to call back and he would be referred to an ENT.  Patient said his ears aren't feeling better.  Patient has been to Dr.McQueen and he'd like to be referred to him.  Patient prefers a morning appointment, but can go anytime.

## 2015-01-03 NOTE — Telephone Encounter (Signed)
Referral ordered

## 2015-01-05 ENCOUNTER — Encounter: Payer: Self-pay | Admitting: Internal Medicine

## 2015-01-05 DIAGNOSIS — I739 Peripheral vascular disease, unspecified: Secondary | ICD-10-CM | POA: Diagnosis not present

## 2015-01-05 DIAGNOSIS — I6523 Occlusion and stenosis of bilateral carotid arteries: Secondary | ICD-10-CM | POA: Diagnosis not present

## 2015-01-05 DIAGNOSIS — I10 Essential (primary) hypertension: Secondary | ICD-10-CM | POA: Diagnosis not present

## 2015-01-05 DIAGNOSIS — F17209 Nicotine dependence, unspecified, with unspecified nicotine-induced disorders: Secondary | ICD-10-CM | POA: Diagnosis not present

## 2015-01-05 DIAGNOSIS — E7800 Pure hypercholesterolemia, unspecified: Secondary | ICD-10-CM | POA: Diagnosis not present

## 2015-01-19 DIAGNOSIS — H6980 Other specified disorders of Eustachian tube, unspecified ear: Secondary | ICD-10-CM | POA: Diagnosis not present

## 2015-01-19 DIAGNOSIS — H6123 Impacted cerumen, bilateral: Secondary | ICD-10-CM | POA: Diagnosis not present

## 2015-01-19 DIAGNOSIS — H6061 Unspecified chronic otitis externa, right ear: Secondary | ICD-10-CM | POA: Diagnosis not present

## 2015-01-19 DIAGNOSIS — H60393 Other infective otitis externa, bilateral: Secondary | ICD-10-CM | POA: Diagnosis not present

## 2015-01-19 DIAGNOSIS — J0100 Acute maxillary sinusitis, unspecified: Secondary | ICD-10-CM | POA: Diagnosis not present

## 2015-02-24 ENCOUNTER — Ambulatory Visit (INDEPENDENT_AMBULATORY_CARE_PROVIDER_SITE_OTHER): Payer: Commercial Managed Care - HMO | Admitting: Internal Medicine

## 2015-02-24 ENCOUNTER — Encounter: Payer: Self-pay | Admitting: Internal Medicine

## 2015-02-24 VITALS — BP 138/80 | HR 67 | Temp 98.6°F | Ht 68.0 in | Wt 245.0 lb

## 2015-02-24 DIAGNOSIS — Z0000 Encounter for general adult medical examination without abnormal findings: Secondary | ICD-10-CM | POA: Diagnosis not present

## 2015-02-24 DIAGNOSIS — I779 Disorder of arteries and arterioles, unspecified: Secondary | ICD-10-CM

## 2015-02-24 DIAGNOSIS — Z7189 Other specified counseling: Secondary | ICD-10-CM

## 2015-02-24 DIAGNOSIS — E785 Hyperlipidemia, unspecified: Secondary | ICD-10-CM | POA: Diagnosis not present

## 2015-02-24 DIAGNOSIS — D126 Benign neoplasm of colon, unspecified: Secondary | ICD-10-CM

## 2015-02-24 DIAGNOSIS — I739 Peripheral vascular disease, unspecified: Secondary | ICD-10-CM

## 2015-02-24 DIAGNOSIS — I10 Essential (primary) hypertension: Secondary | ICD-10-CM | POA: Diagnosis not present

## 2015-02-24 DIAGNOSIS — Z23 Encounter for immunization: Secondary | ICD-10-CM

## 2015-02-24 LAB — LIPID PANEL
CHOL/HDL RATIO: 4
CHOLESTEROL: 195 mg/dL (ref 0–200)
HDL: 44.2 mg/dL (ref >39.00)
NonHDL: 151.07
TRIGLYCERIDES: 268 mg/dL — AB (ref 0.0–149.0)
VLDL: 53.6 mg/dL — AB (ref 0.0–40.0)

## 2015-02-24 LAB — COMPREHENSIVE METABOLIC PANEL
ALBUMIN: 4 g/dL (ref 3.5–5.2)
ALT: 28 U/L (ref 0–53)
AST: 21 U/L (ref 0–37)
Alkaline Phosphatase: 65 U/L (ref 39–117)
BILIRUBIN TOTAL: 0.5 mg/dL (ref 0.2–1.2)
BUN: 14 mg/dL (ref 6–23)
CHLORIDE: 105 meq/L (ref 96–112)
CO2: 28 mEq/L (ref 19–32)
CREATININE: 0.8 mg/dL (ref 0.40–1.50)
Calcium: 9.5 mg/dL (ref 8.4–10.5)
GFR: 101.87 mL/min (ref >60.00)
Glucose, Bld: 99 mg/dL (ref 70–99)
Potassium: 4.2 mEq/L (ref 3.5–5.1)
Sodium: 139 mEq/L (ref 135–145)
TOTAL PROTEIN: 6.9 g/dL (ref 6.0–8.3)

## 2015-02-24 LAB — CBC WITH DIFFERENTIAL/PLATELET
BASOS ABS: 0.1 10*3/uL (ref 0.0–0.1)
Basophils Relative: 0.7 % (ref 0.0–3.0)
Eosinophils Absolute: 0.4 10*3/uL (ref 0.0–0.7)
Eosinophils Relative: 4.5 % (ref 0.0–5.0)
HCT: 40.2 % (ref 39.0–52.0)
HEMOGLOBIN: 13.9 g/dL (ref 13.0–17.0)
LYMPHS ABS: 2 10*3/uL (ref 0.7–4.0)
Lymphocytes Relative: 24.2 % (ref 12.0–46.0)
MCHC: 34.5 g/dL (ref 30.0–36.0)
MCV: 98.7 fl (ref 78.0–100.0)
MONO ABS: 0.5 10*3/uL (ref 0.1–1.0)
Monocytes Relative: 6.6 % (ref 3.0–12.0)
NEUTROS PCT: 64 % (ref 43.0–77.0)
Neutro Abs: 5.2 10*3/uL (ref 1.4–7.7)
Platelets: 204 10*3/uL (ref 150.0–400.0)
RBC: 4.07 Mil/uL — AB (ref 4.22–5.81)
RDW: 12 % (ref 11.5–15.5)
WBC: 8.1 10*3/uL (ref 4.0–10.5)

## 2015-02-24 LAB — LDL CHOLESTEROL, DIRECT: Direct LDL: 99 mg/dL

## 2015-02-24 MED ORDER — DOXAZOSIN MESYLATE 4 MG PO TABS: 4.0000 mg | ORAL_TABLET | Freq: Every day | ORAL | Status: DC

## 2015-02-24 MED ORDER — SIMVASTATIN 20 MG PO TABS: 20.0000 mg | ORAL_TABLET | Freq: Every day | ORAL | Status: DC

## 2015-02-24 MED ORDER — VALSARTAN 160 MG PO TABS
160.0000 mg | ORAL_TABLET | Freq: Every day | ORAL | Status: DC
Start: 2015-02-24 — End: 2015-08-17

## 2015-02-24 MED ORDER — METOPROLOL SUCCINATE ER 100 MG PO TB24: 100.0000 mg | ORAL_TABLET | Freq: Two times a day (BID) | ORAL | Status: DC

## 2015-02-24 NOTE — Assessment & Plan Note (Signed)
I have personally reviewed the Medicare Annual Wellness questionnaire and have noted 1. The patient's medical and social history 2. Their use of alcohol, tobacco or illicit drugs 3. Their current medications and supplements 4. The patient's functional ability including ADL's, fall risks, home safety risks and hearing or visual             impairment. 5. Diet and physical activities 6. Evidence for depression or mood disorders  The patients weight, height, BMI and visual acuity have been recorded in the chart I have made referrals, counseling and provided education to the patient based review of the above and I have provided the pt with a written personalized care plan for preventive services.  I have provided you with a copy of your personalized plan for preventive services. Please take the time to review along with your updated medication list.  Will accept prevnar but doesn't want flu vaccine Discussed cigarette cessation---he will consider Recommended cutting down some on the alcohol [facility] Overdue for colon with his history of polyps Doesn't want PSA after discussion

## 2015-02-24 NOTE — Assessment & Plan Note (Signed)
Not very active but no claudication now Discussed cigarette cessation

## 2015-02-24 NOTE — Addendum Note (Signed)
Addended by: Despina Hidden on: 02/24/2015 11:55 AM   Modules accepted: Orders

## 2015-02-24 NOTE — Addendum Note (Signed)
Addended by: Marchia Bond on: 02/24/2015 04:20 PM   Modules accepted: Miquel Dunn

## 2015-02-24 NOTE — Assessment & Plan Note (Signed)
No problems with statin 

## 2015-02-24 NOTE — Progress Notes (Signed)
Pre visit review using our clinic review tool, if applicable. No additional management support is needed unless otherwise documented below in the visit note. 

## 2015-02-24 NOTE — Addendum Note (Signed)
Addended by: Despina Hidden on: 02/24/2015 11:26 AM   Modules accepted: Orders
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
## 2015-02-24 NOTE — Assessment & Plan Note (Signed)
BP Readings from Last 3 Encounters:  02/24/15 138/80  12/13/14 138/70  07/01/14 145/65   Reasonable control

## 2015-02-24 NOTE — Assessment & Plan Note (Signed)
Dr Einar Gip follows No intervention yet

## 2015-02-24 NOTE — Assessment & Plan Note (Signed)
See social history Blank forms given 

## 2015-02-27 ENCOUNTER — Encounter: Payer: Self-pay | Admitting: *Deleted

## 2015-04-03 ENCOUNTER — Telehealth: Payer: Self-pay | Admitting: Internal Medicine

## 2015-04-03 NOTE — Telephone Encounter (Signed)
He told me [facility]---see if you can track it down

## 2015-04-03 NOTE — Telephone Encounter (Signed)
Patient called to schedule a Colonoscopy at [place]. Patient said he tried to make the appointment, but he was told they would need his records from his old GI doctor.  Patient can't remember where he had the Colonoscopy at because it has been 10 years.  Patient said if he can't go to [place] then he said he'd go back to [place].  Patient prefers a morning appointment.

## 2015-04-07 NOTE — Telephone Encounter (Signed)
Request faxed to [facility] 

## 2015-04-10 ENCOUNTER — Telehealth: Payer: Self-pay | Admitting: Internal Medicine

## 2015-04-10 NOTE — Telephone Encounter (Signed)
Previous records received from [facility]/Dr. Allen Norris [facility]).  Faxed to [facility] for MD review. Pt aware

## 2015-04-10 NOTE — Telephone Encounter (Signed)
Received Colon and path report and placed on Dr. Celesta Aver desk for review. Dr. Carlean Purl is Doc of the Day

## 2015-04-12 ENCOUNTER — Encounter: Payer: Self-pay | Admitting: Internal Medicine

## 2015-04-12 NOTE — Telephone Encounter (Signed)
Dr. Carlean Purl reviewed records and has accepted patient. Ok to schedule Direct Colon. Appointment scheduled.

## 2015-05-22 ENCOUNTER — Ambulatory Visit (INDEPENDENT_AMBULATORY_CARE_PROVIDER_SITE_OTHER): Payer: Commercial Managed Care - HMO | Admitting: Internal Medicine

## 2015-05-22 ENCOUNTER — Encounter: Payer: Self-pay | Admitting: Internal Medicine

## 2015-05-22 VITALS — BP 118/72 | HR 79 | Temp 97.7°F | Wt 244.0 lb

## 2015-05-22 DIAGNOSIS — L578 Other skin changes due to chronic exposure to nonionizing radiation: Secondary | ICD-10-CM | POA: Diagnosis not present

## 2015-05-22 NOTE — Patient Instructions (Signed)
Please go back to the moisturizing soap and use a greasy cream on your arms after bathing. Always use sun block on your arms and face.

## 2015-05-22 NOTE — Progress Notes (Signed)
Pre visit review using our clinic review tool, if applicable. No additional management support is needed unless otherwise documented below in the visit note. 

## 2015-05-22 NOTE — Assessment & Plan Note (Signed)
On arms Discussed sun block Go back to [employer] and greasy cream after showering If worsens, okay to go to derm
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
## 2015-06-07 ENCOUNTER — Ambulatory Visit (AMBULATORY_SURGERY_CENTER): Payer: Self-pay | Admitting: *Deleted

## 2015-06-07 VITALS — Ht 68.0 in | Wt 245.0 lb

## 2015-06-07 DIAGNOSIS — Z86010 Personal history of colonic polyps: Secondary | ICD-10-CM

## 2015-06-07 NOTE — Progress Notes (Signed)
No egg or soy allergy known to patient  No issues with past sedation with any surgeries  or procedures, no intubation problems  No diet pills per patient No home 02 use per patient  No blood thinners per patient  Pt denies issues with constipation  emmi declined

## 2015-06-08 ENCOUNTER — Encounter: Payer: Self-pay | Admitting: Internal Medicine

## 2015-06-21 ENCOUNTER — Ambulatory Visit (AMBULATORY_SURGERY_CENTER): Payer: Commercial Managed Care - HMO | Admitting: Internal Medicine

## 2015-06-21 ENCOUNTER — Encounter: Payer: Self-pay | Admitting: Internal Medicine

## 2015-06-21 VITALS — BP 113/71 | HR 61 | Temp 97.3°F | Resp 12 | Ht 68.0 in | Wt 244.0 lb

## 2015-06-21 DIAGNOSIS — E669 Obesity, unspecified: Secondary | ICD-10-CM | POA: Diagnosis not present

## 2015-06-21 DIAGNOSIS — D125 Benign neoplasm of sigmoid colon: Secondary | ICD-10-CM

## 2015-06-21 DIAGNOSIS — Z86010 Personal history of colonic polyps: Secondary | ICD-10-CM

## 2015-06-21 DIAGNOSIS — N400 Enlarged prostate without lower urinary tract symptoms: Secondary | ICD-10-CM | POA: Diagnosis not present

## 2015-06-21 DIAGNOSIS — I2510 Atherosclerotic heart disease of native coronary artery without angina pectoris: Secondary | ICD-10-CM | POA: Diagnosis not present

## 2015-06-21 DIAGNOSIS — Z1211 Encounter for screening for malignant neoplasm of colon: Secondary | ICD-10-CM | POA: Diagnosis not present

## 2015-06-21 DIAGNOSIS — K635 Polyp of colon: Secondary | ICD-10-CM | POA: Diagnosis not present

## 2015-06-21 DIAGNOSIS — D120 Benign neoplasm of cecum: Secondary | ICD-10-CM

## 2015-06-21 DIAGNOSIS — I739 Peripheral vascular disease, unspecified: Secondary | ICD-10-CM | POA: Diagnosis not present

## 2015-06-21 MED ORDER — SODIUM CHLORIDE 0.9 % IV SOLN: 500.0000 mL | INTRAVENOUS | Status: DC

## 2015-06-21 NOTE — Progress Notes (Signed)
Stable to RR 

## 2015-06-21 NOTE — Op Note (Signed)
[place] Patient Name: Brian Clarke Procedure Date: 06/21/2015 12:43 PM MRN: [id number] Endoscopist: Gatha Mayer , MD Age: 70 Referring MD:  Date of Birth: 04-24-1945 Gender: Male Procedure:                Colonoscopy Indications:              Surveillance: Personal history of adenomatous                            polyps on last colonoscopy > 5 years ago Medicines:                Propofol per Anesthesia, Monitored Anesthesia Care Procedure:                Pre-Anesthesia Assessment:                           - Prior to the procedure, a History and Physical                            was performed, and patient medications and                            allergies were reviewed. The patient's tolerance of                            previous anesthesia was also reviewed. The risks                            and benefits of the procedure and the sedation                            options and risks were discussed with the patient.                            All questions were answered, and informed consent                            was obtained. Prior Anticoagulants: The patient                            last took aspirin 4 days prior to the procedure.                            ASA Grade Assessment: III - A patient with severe                            systemic disease. After reviewing the risks and                            benefits, the patient was deemed in satisfactory                            condition to undergo the procedure.  After obtaining informed consent, the colonoscope                            was passed under direct vision. Throughout the                            procedure, the patient's blood pressure, pulse, and                            oxygen saturations were monitored continuously. The                            Model CF-HQ190L [id number] [id number]) scope was introduced                            through the anus and advanced  to the the cecum,                            identified by appendiceal orifice and ileocecal                            valve. The colonoscopy was performed without                            difficulty. The patient tolerated the procedure                            well. The quality of the bowel preparation was                            adequate. The bowel preparation used was Miralax.                            The ileocecal valve, appendiceal orifice, and                            rectum were photographed. Scope In: 1:37:18 PM Scope Out: 1:48:45 PM Scope Withdrawal Time: 0 hours 9 minutes 29 seconds  Total Procedure Duration: 0 hours 11 minutes 27 seconds  Findings:                 The perianal examination was normal.                           The digital rectal exam findings include decreased                            sphincter tone. Pertinent negatives include normal                            prostate (size, shape, and consistency).                           Two sessile polyps were found in the sigmoid colon  and cecum. The polyps were 3 to 5 mm in size. These                            polyps were removed with a cold snare. Resection                            and retrieval were complete. Verification of                            patient identification for the specimen was done.                            Estimated blood loss was minimal.                           Multiple diverticula were found in the sigmoid                            colon.                           The exam was otherwise without abnormality on                            direct and retroflexion views. Complications:            No immediate complications. Estimated Blood Loss:     Estimated blood loss was minimal. Impression:               - Decreased sphincter tone found on digital rectal                            exam.                           - Two 3 to 5 mm polyps in the  sigmoid colon and in                            the cecum, removed with a cold snare. Resected and                            retrieved.                           - Diverticulosis in the sigmoid colon.                           - The examination was otherwise normal on direct                            and retroflexion views.                           - Personal history of colonic polyps. 2 adenomas  2008 Recommendation:           - Patient has a contact number available for                            emergencies. The signs and symptoms of potential                            delayed complications were discussed with the                            patient. Return to normal activities tomorrow.                            Written discharge instructions were provided to the                            patient.                           - Resume previous diet.                           - Continue present medications.                           - Repeat colonoscopy is recommended. The                            colonoscopy date will be determined after pathology                            results from today's exam become available for                            review. Gatha Mayer, MD 06/21/2015 2:01:10 PM This report has been signed electronically.

## 2015-06-21 NOTE — Patient Instructions (Addendum)
I found and removed 2 small polyps that look benign.  I will let you know pathology results and when to have another routine colonoscopy by mail.  I appreciate the opportunity to care for you. Gatha Mayer, MD, FACG  YOU HAD AN ENDOSCOPIC PROCEDURE TODAY AT [facility]:   Refer to the procedure report that was given to you for any specific questions about what was found during the examination.  If the procedure report does not answer your questions, please call your gastroenterologist to clarify.  If you requested that your care partner not be given the details of your procedure findings, then the procedure report has been included in a sealed envelope for you to review at your convenience later.  YOU SHOULD EXPECT: Some feelings of bloating in the abdomen. Passage of more gas than usual.  Walking can help get rid of the air that was put into your GI tract during the procedure and reduce the bloating. If you had a lower endoscopy (such as a colonoscopy or flexible sigmoidoscopy) you may notice spotting of blood in your stool or on the toilet paper. If you underwent a bowel prep for your procedure, you may not have a normal bowel movement for a few days.  Please Note:  You might notice some irritation and congestion in your nose or some drainage.  This is from the oxygen used during your procedure.  There is no need for concern and it should clear up in a day or so.  SYMPTOMS TO REPORT IMMEDIATELY:   Following lower endoscopy (colonoscopy or flexible sigmoidoscopy):  Excessive amounts of blood in the stool  Significant tenderness or worsening of abdominal pains  Swelling of the abdomen that is new, acute  Fever of 100F or higher   For urgent or emergent issues, a gastroenterologist can be reached at any hour by calling [phone].   DIET: Your first meal following the procedure should be a small meal and then it is ok to progress to your normal diet. Heavy  or fried foods are harder to digest and may make you feel nauseous or bloated.  Likewise, meals heavy in dairy and vegetables can increase bloating.  Drink plenty of fluids but you should avoid alcoholic beverages for 24 hours.  ACTIVITY:  You should plan to take it easy for the rest of today and you should NOT DRIVE or use heavy machinery until tomorrow (because of the sedation medicines used during the test).    FOLLOW UP: Our staff will call the number listed on your records the next business day following your procedure to check on you and address any questions or concerns that you may have regarding the information given to you following your procedure. If we do not reach you, we will leave a message.  However, if you are feeling well and you are not experiencing any problems, there is no need to return our call.  We will assume that you have returned to your regular daily activities without incident.  If any biopsies were taken you will be contacted by phone or by letter within the next 1-3 weeks.  Please call us at [phone] if you have not heard about the biopsies in 3 weeks.    SIGNATURES/CONFIDENTIALITY: You and/or your care partner have signed paperwork which will be entered into your electronic medical record.  These signatures attest to the fact that that the information above on your After Visit Summary has been reviewed and  is understood.  Full responsibility of the confidentiality of this discharge information lies with you and/or your care-partner.  Polyp and diverticulosis information given.

## 2015-06-21 NOTE — Progress Notes (Signed)
Called to room to assist during endoscopic procedure.  Patient ID and intended procedure confirmed with present staff. Received instructions for my participation in the procedure from the performing physician.  

## 2015-06-22 ENCOUNTER — Telehealth: Payer: Self-pay

## 2015-06-22 NOTE — Telephone Encounter (Signed)
  Follow up Call-  Call back number 06/21/2015  Post procedure Call Back phone  # [phone]  Permission to leave phone message No  comments NO VOICEMAIL     Patient questions:  Do you have a fever, pain , or abdominal swelling? No. Pain Score  0 *  Have you tolerated food without any problems? Yes.    Have you been able to return to your normal activities? Yes.    Do you have any questions about your discharge instructions: Diet   No. Medications  No. Follow up visit  No.  Do you have questions or concerns about your Care? No.  Actions: * If pain score is 4 or above: No action needed, pain <4.

## 2015-07-05 DIAGNOSIS — I6523 Occlusion and stenosis of bilateral carotid arteries: Secondary | ICD-10-CM | POA: Diagnosis not present

## 2015-07-12 ENCOUNTER — Encounter: Payer: Self-pay | Admitting: Internal Medicine

## 2015-07-12 NOTE — Progress Notes (Signed)
Quick Note:  Diminutive adenoma and a mucosal polyp   recall 2024 - 7 yrs ______

## 2015-07-14 DIAGNOSIS — F17209 Nicotine dependence, unspecified, with unspecified nicotine-induced disorders: Secondary | ICD-10-CM | POA: Diagnosis not present

## 2015-07-14 DIAGNOSIS — I6523 Occlusion and stenosis of bilateral carotid arteries: Secondary | ICD-10-CM | POA: Diagnosis not present

## 2015-07-14 DIAGNOSIS — I739 Peripheral vascular disease, unspecified: Secondary | ICD-10-CM | POA: Diagnosis not present

## 2015-07-14 DIAGNOSIS — I10 Essential (primary) hypertension: Secondary | ICD-10-CM | POA: Diagnosis not present

## 2015-08-01 DIAGNOSIS — I739 Peripheral vascular disease, unspecified: Secondary | ICD-10-CM | POA: Diagnosis not present

## 2015-08-09 ENCOUNTER — Encounter: Payer: Self-pay | Admitting: Internal Medicine

## 2015-08-09 DIAGNOSIS — F17209 Nicotine dependence, unspecified, with unspecified nicotine-induced disorders: Secondary | ICD-10-CM | POA: Diagnosis not present

## 2015-08-09 DIAGNOSIS — E782 Mixed hyperlipidemia: Secondary | ICD-10-CM | POA: Diagnosis not present

## 2015-08-09 DIAGNOSIS — I10 Essential (primary) hypertension: Secondary | ICD-10-CM | POA: Diagnosis not present

## 2015-08-09 DIAGNOSIS — I739 Peripheral vascular disease, unspecified: Secondary | ICD-10-CM | POA: Diagnosis not present

## 2015-08-17 ENCOUNTER — Telehealth: Payer: Self-pay

## 2015-08-17 NOTE — Telephone Encounter (Signed)
Corrected med list based on [facility]

## 2015-08-24 DIAGNOSIS — I739 Peripheral vascular disease, unspecified: Secondary | ICD-10-CM | POA: Diagnosis not present
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
## 2015-08-29 ENCOUNTER — Ambulatory Visit (HOSPITAL_COMMUNITY)
Admission: RE | Admit: 2015-08-29 | Discharge: 2015-08-29 | Disposition: A | Discharge status: Home / Self Care | Payer: Commercial Managed Care - HMO | Source: Ambulatory Visit | Attending: Cardiology | Admitting: Cardiology

## 2015-08-29 ENCOUNTER — Encounter (HOSPITAL_COMMUNITY)
Admission: RE | Disposition: A | Discharge status: Home / Self Care | Payer: Self-pay | Source: Ambulatory Visit | Attending: Cardiology

## 2015-08-29 ENCOUNTER — Encounter (HOSPITAL_COMMUNITY): Payer: Self-pay | Admitting: *Deleted

## 2015-08-29 DIAGNOSIS — E7800 Pure hypercholesterolemia, unspecified: Secondary | ICD-10-CM | POA: Diagnosis not present

## 2015-08-29 DIAGNOSIS — I70213 Atherosclerosis of native arteries of extremities with intermittent claudication, bilateral legs: Secondary | ICD-10-CM | POA: Diagnosis not present

## 2015-08-29 DIAGNOSIS — F17210 Nicotine dependence, cigarettes, uncomplicated: Secondary | ICD-10-CM | POA: Diagnosis not present

## 2015-08-29 DIAGNOSIS — I739 Peripheral vascular disease, unspecified: Secondary | ICD-10-CM | POA: Diagnosis present

## 2015-08-29 DIAGNOSIS — E782 Mixed hyperlipidemia: Secondary | ICD-10-CM | POA: Insufficient documentation

## 2015-08-29 DIAGNOSIS — Z7982 Long term (current) use of aspirin: Secondary | ICD-10-CM | POA: Diagnosis not present

## 2015-08-29 DIAGNOSIS — I10 Essential (primary) hypertension: Secondary | ICD-10-CM | POA: Insufficient documentation

## 2015-08-29 DIAGNOSIS — F1010 Alcohol abuse, uncomplicated: Secondary | ICD-10-CM | POA: Diagnosis not present

## 2015-08-29 DIAGNOSIS — I6523 Occlusion and stenosis of bilateral carotid arteries: Secondary | ICD-10-CM | POA: Insufficient documentation

## 2015-08-29 HISTORY — PX: PERIPHERAL VASCULAR CATHETERIZATION: SHX172C

## 2015-08-29 SURGERY — LOWER EXTREMITY ANGIOGRAPHY

## 2015-08-29 MED ORDER — MIDAZOLAM HCL 2 MG/2ML IJ SOLN
INTRAMUSCULAR | Status: DC | PRN
  Administered 2015-08-29: 2 mg via INTRAVENOUS

## 2015-08-29 MED ORDER — SODIUM CHLORIDE 0.9% FLUSH: 3.0000 mL | INTRAVENOUS | Status: DC | PRN

## 2015-08-29 MED ORDER — HEPARIN (PORCINE) IN NACL 2-0.9 UNIT/ML-% IJ SOLN
INTRAMUSCULAR | Status: AC
  Filled 2015-08-29: qty 1000

## 2015-08-29 MED ORDER — SODIUM CHLORIDE 0.9 % IV SOLN
INTRAVENOUS | Status: DC
  Administered 2015-08-29: 10:00:00 via INTRAVENOUS

## 2015-08-29 MED ORDER — IODIXANOL 320 MG/ML IV SOLN
INTRAVENOUS | Status: DC | PRN
  Administered 2015-08-29: 90 mL via INTRA_ARTERIAL

## 2015-08-29 MED ORDER — LIDOCAINE HCL (PF) 1 % IJ SOLN
INTRAMUSCULAR | Status: AC
Start: 2015-08-29 — End: 2015-08-29
  Filled 2015-08-29: qty 30

## 2015-08-29 MED ORDER — SODIUM CHLORIDE 0.9% FLUSH: 3.0000 mL | Freq: Two times a day (BID) | INTRAVENOUS | Status: DC

## 2015-08-29 MED ORDER — FENTANYL CITRATE (PF) 100 MCG/2ML IJ SOLN
INTRAMUSCULAR | Status: DC | PRN
  Administered 2015-08-29: 25 ug via INTRAVENOUS

## 2015-08-29 MED ORDER — SODIUM CHLORIDE 0.9 % IV BOLUS (SEPSIS)
500.0000 mL | Freq: Once | INTRAVENOUS | Status: AC
  Administered 2015-08-29: 500 mL via INTRAVENOUS

## 2015-08-29 MED ORDER — FENTANYL CITRATE (PF) 100 MCG/2ML IJ SOLN
INTRAMUSCULAR | Status: AC
  Filled 2015-08-29: qty 2

## 2015-08-29 MED ORDER — SODIUM CHLORIDE 0.9 % IV SOLN: 250.0000 mL | INTRAVENOUS | Status: DC | PRN

## 2015-08-29 MED ORDER — HEPARIN (PORCINE) IN NACL 2-0.9 UNIT/ML-% IJ SOLN
INTRAMUSCULAR | Status: DC | PRN
  Administered 2015-08-29: 1000 mL

## 2015-08-29 MED ORDER — LIDOCAINE HCL (PF) 1 % IJ SOLN
INTRAMUSCULAR | Status: DC | PRN
  Administered 2015-08-29: 15 mL

## 2015-08-29 MED ORDER — MIDAZOLAM HCL 2 MG/2ML IJ SOLN
INTRAMUSCULAR | Status: AC
  Filled 2015-08-29: qty 2

## 2015-08-29 SURGICAL SUPPLY — 8 items
CATH OMNI FLUSH 5F 65CM (CATHETERS) ×2 IMPLANT
KIT MICROINTRODUCER STIFF 5F (SHEATH) ×2 IMPLANT
KIT PV (KITS) ×3 IMPLANT
SHEATH PINNACLE 5F 10CM (SHEATH) ×2 IMPLANT
SYR MEDRAD MARK V 150ML (SYRINGE) ×3 IMPLANT
TRANSDUCER W/STOPCOCK (MISCELLANEOUS) ×3 IMPLANT
TRAY PV CATH (CUSTOM PROCEDURE TRAY) ×3 IMPLANT
WIRE HITORQ VERSACORE ST 145CM (WIRE) ×2 IMPLANT

## 2015-08-29 NOTE — Progress Notes (Signed)
Sheath removal right femoral artery.  20 min manual pressure held.  Level 0.  papable PT pulse right foot.  Vital stable, see flowsheet.  Bed rest begins 9:05am.

## 2015-08-29 NOTE — Interval H&P Note (Signed)
History and Physical Interval Note:  08/29/2015 6:39 AM  Brian Clarke  has presented today for surgery, with the diagnosis of claudication  The various methods of treatment have been discussed with the patient and family. After consideration of risks, benefits and other options for treatment, the patient has consented to  Procedure(s): Lower Extremity Angiography (N/A) and possible angioplasty as a surgical intervention .  The patient's history has been reviewed, patient examined, no change in status, stable for surgery.  I have reviewed the patient's chart and labs.  Questions were answered to the patient's satisfaction.     [name]

## 2015-08-29 NOTE — Discharge Instructions (Signed)

## 2015-08-29 NOTE — Progress Notes (Signed)
Patient discharged in stable condition to home with no complaints.  Right groin at level zero.  Patient and patients wife educated on self care with no question's asked.

## 2015-09-01 ENCOUNTER — Telehealth: Payer: Self-pay

## 2015-09-01 NOTE — Telephone Encounter (Signed)
Dr Silvio Pate had asked that I correct his med list with the Valsartan-HCT. It is already there  Metoprolol Succinate ER (100MG  Tablet ER 24HR, 1 Tablet ER 24HR Tablet ER 24 Oral two times daily, Taken starting 02/13/2015) Active.  Doxazosin Mesylate (4MG  Tablet, 1 Tablet Tablet Tablet Oral daily, Taken starting 12/12/2014) Active.  Simvastatin (20MG  Tablet, 1 Tablet Tablet Tablet Oral daily, Taken starting 11/08/2014) Active.  Clopidogrel Bisulfate (75MG  Tablet, 1 Tablet Tablet Tablet Tablet Oral daily, Taken starting 07/14/2014) Active. (holding for lithotrypsy 07/01/2014)  Aspirin (81MG  Tablet, 1 Oral daily) Active.

## 2015-09-07 DIAGNOSIS — F17209 Nicotine dependence, unspecified, with unspecified nicotine-induced disorders: Secondary | ICD-10-CM | POA: Diagnosis not present

## 2015-09-07 DIAGNOSIS — G622 Polyneuropathy due to other toxic agents: Secondary | ICD-10-CM | POA: Diagnosis not present

## 2015-09-07 DIAGNOSIS — I739 Peripheral vascular disease, unspecified: Secondary | ICD-10-CM | POA: Diagnosis not present

## 2015-10-09 ENCOUNTER — Ambulatory Visit (INDEPENDENT_AMBULATORY_CARE_PROVIDER_SITE_OTHER): Payer: Commercial Managed Care - HMO | Admitting: Internal Medicine

## 2015-10-09 ENCOUNTER — Encounter: Payer: Self-pay | Admitting: Internal Medicine

## 2015-10-09 VITALS — BP 130/82 | HR 84 | Temp 98.8°F | Wt 237.2 lb

## 2015-10-09 DIAGNOSIS — J302 Other seasonal allergic rhinitis: Secondary | ICD-10-CM

## 2015-10-09 NOTE — Patient Instructions (Signed)
Allergic Rhinitis Allergic rhinitis is when the mucous membranes in the nose respond to allergens. Allergens are particles in the air that cause your body to have an allergic reaction. This causes you to release allergic antibodies. Through a chain of events, these eventually cause you to release histamine into the blood stream. Although meant to protect the body, it is this release of histamine that causes your discomfort, such as frequent sneezing, congestion, and an itchy, runny nose.  CAUSES Seasonal allergic rhinitis (hay fever) is caused by pollen allergens that may come from grasses, trees, and weeds. Year-round allergic rhinitis (perennial allergic rhinitis) is caused by allergens such as house dust mites, pet dander, and mold spores. SYMPTOMS  Nasal stuffiness (congestion).  Itchy, runny nose with sneezing and tearing of the eyes. DIAGNOSIS Your health care provider can help you determine the allergen or allergens that trigger your symptoms. If you and your health care provider are unable to determine the allergen, skin or blood testing may be used. Your health care provider will diagnose your condition after taking your health history and performing a physical exam. Your health care provider may assess you for other related conditions, such as asthma, pink eye, or an ear infection. TREATMENT Allergic rhinitis does not have a cure, but it can be controlled by:  Medicines that block allergy symptoms. These may include allergy shots, nasal sprays, and oral antihistamines.  Avoiding the allergen. Hay fever may often be treated with antihistamines in pill or nasal spray forms. Antihistamines block the effects of histamine. There are over-the-counter medicines that may help with nasal congestion and swelling around the eyes. Check with your health care provider before taking or giving this medicine. If avoiding the allergen or the medicine prescribed do not work, there are many new medicines  your health care provider can prescribe. Stronger medicine may be used if initial measures are ineffective. Desensitizing injections can be used if medicine and avoidance does not work. Desensitization is when a patient is given ongoing shots until the body becomes less sensitive to the allergen. Make sure you follow up with your health care provider if problems continue. HOME CARE INSTRUCTIONS It is not possible to completely avoid allergens, but you can reduce your symptoms by taking steps to limit your exposure to them. It helps to know exactly what you are allergic to so that you can avoid your specific triggers. SEEK MEDICAL CARE IF:  You have a fever.  You develop a cough that does not stop easily (persistent).  You have shortness of breath.  You start wheezing.  Symptoms interfere with normal daily activities.   This information is not intended to replace advice given to you by your health care provider. Make sure you discuss any questions you have with your health care provider.   Document Released: 09/18/2000 Document Revised: 01/14/2014 Document Reviewed: 08/31/2012 Elsevier Interactive Patient Education 2016 [employer]  

## 2015-10-09 NOTE — Progress Notes (Signed)
HPI  Pt presents to the clinic today with c/o headache, runny nose, sore throat and cough. This started 9 days ago. He is blowing clear mucous out of his nose. He denies difficulty swallowing. The cough is productive of clear mucous. He denies fever, chills or body aches. He has tried Mucinex and Copywriter, advertising cold and flu with minimal relief. He has as history of allergies but had not had sick contacts.  Review of Systems      Past Medical History:  Diagnosis Date  . Allergy   . Anxiety   . Arthritis    thumbs   . BPH (benign prostatic hypertrophy)   . Hyperlipidemia   . Hypertension   . Personal history of colonic polyps 10/06/2006  . PVD (peripheral vascular disease) ([place])    Left SFA- 9/1/20150 angioplasty , right SFA- mild to moderate disease, Carotids- 50% stenosis right proximal ICA     Family History  Problem Relation Age of Onset  . Cancer Neg Hx   . Diabetes Neg Hx   . Heart disease Neg Hx   . Colon cancer Neg Hx   . Colon polyps Neg Hx   . Esophageal cancer Neg Hx   . Rectal cancer Neg Hx   . Stomach cancer Neg Hx     Social History   Social History  . Marital status: Married    Spouse name: N/A  . Number of children: 2  . Years of education: N/A   Occupational History  . 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           Social History Main Topics  . Smoking status: Current Every Day Smoker    Packs/day: 1.00    Years: 58.00    Types: Cigarettes  . Smokeless tobacco: Never Used  . Alcohol use 12.6 oz/week    21 Standard drinks or equivalent per week     Comment: 1/2 gallon per week of rum x 35 years -3 a day   . Drug use: No     Comment: hx of marijuana use years ago   . Sexual activity: Not on file   Other Topics Concern  . Not on file   Social History Narrative   No living will   No health care POA but requests wife   Not sure about DNR---will accept resuscitation for now.    No tube feeds if cognitively unaware     Allergies  Allergen Reactions  . Micardis [Telmisartan] Swelling    Tongue swelling  . Tadalafil Other (See Comments)    unknown     Constitutional: Positive headache, fatigue and fever. Denies fever or abrupt weight changes.  HEENT:  Positive runny nose, sore throat. Denies eye redness, eye pain, pressure behind the eyes, facial pain, nasal congestion, ear pain, ringing in the ears, wax buildup, or bloody nose. Respiratory: Positive cough. Denies difficulty breathing or shortness of breath.  Cardiovascular: Denies chest pain, chest tightness, palpitations or swelling in the hands or feet.   No other specific complaints in a complete review of systems (except as listed in HPI above).  Objective:   BP 130/82   Pulse 84   Temp 98.8 F (37.1 C) (Oral)   Wt 237 lb 4 oz (107.6 kg)   SpO2 97%   BMI 35.04 kg/m   Wt Readings from Last 3 Encounters:  10/09/15 237 lb 4 oz (107.6 kg)  08/29/15 240 lb (108.9 kg)  06/21/15 244 lb (  110.7 kg)     General: Appears his stated age, obese in NAD. HEENT: Head: normal shape and size no sinus tenderness noted; Eyes: sclera white, no icterus, conjunctiva pink; Ears: Tm's gray and intact, normal light reflex, + serous effusion, R>L; Nose: mucosa boggy and moist, septum midline; Throat/Mouth: + PND. Teeth present, mucosa pink and moist, no exudate noted, no lesions or ulcerations noted.  Neck: No cervical lymphadenopathy.  Cardiovascular: Normal rate and rhythm. S1,S2 noted.  No murmur, rubs or gallops noted.  Pulmonary/Chest: Normal effort and positive vesicular breath sounds. No respiratory distress. No wheezes, rales or ronchi noted.      Assessment & Plan:   Allergic Rhinitis:  Get some rest and drink plenty of water Do salt water gargles for the sore throat Start Flonase in am, Zyrtec in pm x 1 week 80 mg Depo IM today  RTC as needed or if symptoms persist.   Webb Silversmith, NP

## 2015-12-23 ENCOUNTER — Other Ambulatory Visit: Payer: Self-pay | Admitting: Internal Medicine

## 2016-01-30 ENCOUNTER — Encounter: Payer: Self-pay | Admitting: Internal Medicine

## 2016-01-30 DIAGNOSIS — I6523 Occlusion and stenosis of bilateral carotid arteries: Secondary | ICD-10-CM | POA: Diagnosis not present

## 2016-02-26 ENCOUNTER — Encounter: Payer: Self-pay | Admitting: Internal Medicine

## 2016-02-26 ENCOUNTER — Ambulatory Visit (INDEPENDENT_AMBULATORY_CARE_PROVIDER_SITE_OTHER): Payer: Medicare HMO | Admitting: Internal Medicine

## 2016-02-26 VITALS — BP 132/70 | HR 68 | Temp 98.5°F | Ht 68.0 in | Wt 240.0 lb

## 2016-02-26 DIAGNOSIS — Z0000 Encounter for general adult medical examination without abnormal findings: Secondary | ICD-10-CM

## 2016-02-26 DIAGNOSIS — I10 Essential (primary) hypertension: Secondary | ICD-10-CM | POA: Diagnosis not present

## 2016-02-26 DIAGNOSIS — N138 Other obstructive and reflux uropathy: Secondary | ICD-10-CM

## 2016-02-26 DIAGNOSIS — N401 Enlarged prostate with lower urinary tract symptoms: Secondary | ICD-10-CM | POA: Diagnosis not present

## 2016-02-26 DIAGNOSIS — I739 Peripheral vascular disease, unspecified: Secondary | ICD-10-CM | POA: Diagnosis not present

## 2016-02-26 DIAGNOSIS — I779 Disorder of arteries and arterioles, unspecified: Secondary | ICD-10-CM

## 2016-02-26 DIAGNOSIS — Z7189 Other specified counseling: Secondary | ICD-10-CM | POA: Diagnosis not present

## 2016-02-26 LAB — CBC WITH DIFFERENTIAL/PLATELET
BASOS PCT: 0.8 % (ref 0.0–3.0)
Basophils Absolute: 0.1 10*3/uL (ref 0.0–0.1)
EOS PCT: 3.1 % (ref 0.0–5.0)
Eosinophils Absolute: 0.2 10*3/uL (ref 0.0–0.7)
HCT: 39.4 % (ref 39.0–52.0)
Hemoglobin: 13.4 g/dL (ref 13.0–17.0)
LYMPHS ABS: 2.2 10*3/uL (ref 0.7–4.0)
Lymphocytes Relative: 28.9 % (ref 12.0–46.0)
MCHC: 34.1 g/dL (ref 30.0–36.0)
MCV: 102.8 fl — AB (ref 78.0–100.0)
MONO ABS: 0.7 10*3/uL (ref 0.1–1.0)
Monocytes Relative: 9.2 % (ref 3.0–12.0)
NEUTROS ABS: 4.4 10*3/uL (ref 1.4–7.7)
NEUTROS PCT: 58 % (ref 43.0–77.0)
PLATELETS: 200 10*3/uL (ref 150.0–400.0)
RBC: 3.84 Mil/uL — AB (ref 4.22–5.81)
RDW: 12.2 % (ref 11.5–15.5)
WBC: 7.6 10*3/uL (ref 4.0–10.5)

## 2016-02-26 LAB — COMPREHENSIVE METABOLIC PANEL
ALK PHOS: 61 U/L (ref 39–117)
ALT: 30 U/L (ref 0–53)
AST: 23 U/L (ref 0–37)
Albumin: 4 g/dL (ref 3.5–5.2)
BUN: 14 mg/dL (ref 6–23)
CO2: 29 meq/L (ref 19–32)
Calcium: 9.7 mg/dL (ref 8.4–10.5)
Chloride: 106 mEq/L (ref 96–112)
Creatinine, Ser: 0.98 mg/dL (ref 0.40–1.50)
GFR: 80.36 mL/min (ref >60.00)
GLUCOSE: 93 mg/dL (ref 70–99)
POTASSIUM: 4 meq/L (ref 3.5–5.1)
SODIUM: 141 meq/L (ref 135–145)
TOTAL PROTEIN: 6.9 g/dL (ref 6.0–8.3)
Total Bilirubin: 0.4 mg/dL (ref 0.2–1.2)

## 2016-02-26 LAB — LIPID PANEL
CHOLESTEROL: 158 mg/dL (ref 0–200)
HDL: 46.3 mg/dL (ref >39.00)
NonHDL: 111.58
Total CHOL/HDL Ratio: 3
Triglycerides: 217 mg/dL — ABNORMAL HIGH (ref 0.0–149.0)
VLDL: 43.4 mg/dL — ABNORMAL HIGH (ref 0.0–40.0)

## 2016-02-26 LAB — LDL CHOLESTEROL, DIRECT: LDL DIRECT: 77 mg/dL

## 2016-02-26 NOTE — Assessment & Plan Note (Signed)
Discussed adding finasteride He will let me know if he wants to try

## 2016-02-26 NOTE — Assessment & Plan Note (Signed)
I have personally reviewed the Medicare Annual Wellness questionnaire and have noted 1. The patient's medical and social history 2. Their use of alcohol, tobacco or illicit drugs 3. Their current medications and supplements 4. The patient's functional ability including ADL's, fall risks, home safety risks and hearing or visual             impairment. 5. Diet and physical activities 6. Evidence for depression or mood disorders  The patients weight, height, BMI and visual acuity have been recorded in the chart I have made referrals, counseling and provided education to the patient based review of the above and I have provided the pt with a written personalized care plan for preventive services.  I have provided you with a copy of your personalized plan for preventive services. Please take the time to review along with your updated medication list.  Recent colon--recheck due 2024 Discussed PSA--no more due to age Not willing to stop smoking Discussed cutting back on rum Walks regular

## 2016-02-26 NOTE — Assessment & Plan Note (Signed)
Moderate disease Gets regular ultrasounds No symptoms

## 2016-02-26 NOTE — Assessment & Plan Note (Signed)
BP Readings from Last 3 Encounters:  02/26/16 132/70  10/09/15 130/82  08/29/15 (!) 146/75   Good control now No change

## 2016-02-26 NOTE — Patient Instructions (Addendum)
Please let me know if you want to try an additional pill for your prostate.  DASH Eating Plan DASH stands for "Dietary Approaches to Stop Hypertension." The DASH eating plan is a healthy eating plan that has been shown to reduce high blood pressure (hypertension). Additional health benefits may include reducing the risk of type 2 diabetes mellitus, heart disease, and stroke. The DASH eating plan may also help with weight loss. What do I need to know about the DASH eating plan? For the DASH eating plan, you will follow these general guidelines:  Choose foods with less than 150 milligrams of sodium per serving (as listed on the food label).  Use salt-free seasonings or herbs instead of table salt or sea salt.  Check with your health care provider or pharmacist before using salt substitutes.  Eat lower-sodium products. These are often labeled as "low-sodium" or "no salt added."  Eat fresh foods. Avoid eating a lot of canned foods.  Eat more vegetables, fruits, and low-fat dairy products.  Choose whole grains. Look for the word "whole" as the first word in the ingredient list.  Choose fish and skinless chicken or [place] more often than red meat. Limit fish, poultry, and meat to 6 oz (170 g) each day.  Limit sweets, desserts, sugars, and sugary drinks.  Choose heart-healthy fats.  Eat more home-cooked food and less restaurant, buffet, and fast food.  Limit fried foods.  Do not fry foods. Cook foods using methods such as baking, boiling, grilling, and broiling instead.  When eating at a restaurant, ask that your food be prepared with less salt, or no salt if possible. What foods can I eat? Seek help from a dietitian for individual calorie needs. Grains  Whole grain or whole wheat bread. Brown rice. Whole grain or whole wheat pasta. Quinoa, bulgur, and whole grain cereals. Low-sodium cereals. Corn or whole wheat flour tortillas. Whole grain cornbread. Whole grain crackers. Low-sodium  crackers. Vegetables  Fresh or frozen vegetables (raw, steamed, roasted, or grilled). Low-sodium or reduced-sodium tomato and vegetable juices. Low-sodium or reduced-sodium tomato sauce and paste. Low-sodium or reduced-sodium canned vegetables. Fruits  All fresh, canned (in natural juice), or frozen fruits. Meat and Other Protein Products  Ground beef (85% or leaner), grass-fed beef, or beef trimmed of fat. Skinless chicken or [place]. Ground chicken or [place]. Pork trimmed of fat. All fish and seafood. Eggs. Dried beans, peas, or lentils. Unsalted nuts and seeds. Unsalted canned beans. Dairy  Low-fat dairy products, such as skim or 1% milk, 2% or reduced-fat cheeses, low-fat ricotta or cottage cheese, or plain low-fat yogurt. Low-sodium or reduced-sodium cheeses. Fats and Oils  Tub margarines without trans fats. Light or reduced-fat mayonnaise and salad dressings (reduced sodium). Avocado. Safflower, olive, or canola oils. Natural peanut or almond butter. Other  Unsalted popcorn and pretzels. The items listed above may not be a complete list of recommended foods or beverages. Contact your dietitian for more options.  What foods are not recommended? Grains  White bread. White pasta. White rice. Refined cornbread. Bagels and croissants. Crackers that contain trans fat. Vegetables  Creamed or fried vegetables. Vegetables in a cheese sauce. Regular canned vegetables. Regular canned tomato sauce and paste. Regular tomato and vegetable juices. Fruits  Canned fruit in light or heavy syrup. Fruit juice. Meat and Other Protein Products  Fatty cuts of meat. Ribs, chicken wings, bacon, sausage, bologna, salami, chitterlings, fatback, hot dogs, bratwurst, and packaged luncheon meats. Salted nuts and seeds. Canned beans with salt. Dairy  Whole or 2% milk, cream, half-and-half, and cream cheese. Whole-fat or sweetened yogurt. Full-fat cheeses or blue cheese. Nondairy creamers and whipped toppings.  Processed cheese, cheese spreads, or cheese curds. Condiments  Onion and garlic salt, seasoned salt, table salt, and sea salt. Canned and packaged gravies. Worcestershire sauce. Tartar sauce. Barbecue sauce. Teriyaki sauce. Soy sauce, including reduced sodium. Steak sauce. Fish sauce. Oyster sauce. Cocktail sauce. Horseradish. Ketchup and mustard. Meat flavorings and tenderizers. Bouillon cubes. Hot sauce. Tabasco sauce. Marinades. Taco seasonings. Relishes. Fats and Oils  Butter, stick margarine, lard, shortening, ghee, and bacon fat. Coconut, palm kernel, or palm oils. Regular salad dressings. Other  Pickles and olives. Salted popcorn and pretzels. The items listed above may not be a complete list of foods and beverages to avoid. Contact your dietitian for more information.  Where can I find more information? National Heart, Lung, and Blood Institute: travelstabloid.com This information is not intended to replace advice given to you by your health care provider. Make sure you discuss any questions you have with your health care provider. Document Released: 12/13/2010 Document Revised: 06/01/2015 Document Reviewed: 10/28/2012 Elsevier Interactive Patient Education  2017 [employer].

## 2016-02-26 NOTE — Assessment & Plan Note (Signed)
Ongoing claudication but no new action with recent angiogram ASA and statin

## 2016-02-26 NOTE — Assessment & Plan Note (Signed)
See social history Blank forms given 
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
## 2016-04-24 ENCOUNTER — Other Ambulatory Visit: Payer: Self-pay | Admitting: Internal Medicine

## 2016-06-04 ENCOUNTER — Ambulatory Visit (INDEPENDENT_AMBULATORY_CARE_PROVIDER_SITE_OTHER): Payer: Medicare HMO | Admitting: Primary Care

## 2016-06-04 ENCOUNTER — Encounter: Payer: Self-pay | Admitting: Primary Care

## 2016-06-04 VITALS — BP 144/84 | HR 71 | Temp 98.2°F | Ht 68.0 in | Wt 249.4 lb

## 2016-06-04 DIAGNOSIS — K047 Periapical abscess without sinus: Secondary | ICD-10-CM

## 2016-06-04 DIAGNOSIS — R220 Localized swelling, mass and lump, head: Secondary | ICD-10-CM

## 2016-06-04 MED ORDER — AMOXICILLIN 875 MG PO TABS: 875.0000 mg | ORAL_TABLET | Freq: Two times a day (BID) | ORAL | 0 refills | Status: DC

## 2016-06-04 NOTE — Patient Instructions (Signed)
Start amoxicillin antibiotics. Take 1 tablet by mouth twice daily for 7 days.  Continue Ibuprofen as needed for pain and inflammation.  It was a pleasure meeting you!
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
## 2016-06-28 ENCOUNTER — Other Ambulatory Visit: Payer: Self-pay | Admitting: Internal Medicine

## 2016-07-30 ENCOUNTER — Encounter: Payer: Self-pay | Admitting: Internal Medicine

## 2016-07-30 DIAGNOSIS — I6523 Occlusion and stenosis of bilateral carotid arteries: Secondary | ICD-10-CM | POA: Diagnosis not present

## 2016-09-06 DIAGNOSIS — F1010 Alcohol abuse, uncomplicated: Secondary | ICD-10-CM | POA: Diagnosis not present

## 2016-09-06 DIAGNOSIS — G622 Polyneuropathy due to other toxic agents: Secondary | ICD-10-CM | POA: Diagnosis not present

## 2016-09-06 DIAGNOSIS — I739 Peripheral vascular disease, unspecified: Secondary | ICD-10-CM | POA: Diagnosis not present

## 2016-09-06 DIAGNOSIS — I10 Essential (primary) hypertension: Secondary | ICD-10-CM | POA: Diagnosis not present

## 2016-09-06 DIAGNOSIS — I951 Orthostatic hypotension: Secondary | ICD-10-CM | POA: Diagnosis not present

## 2016-10-03 DIAGNOSIS — I10 Essential (primary) hypertension: Secondary | ICD-10-CM | POA: Diagnosis not present

## 2016-10-03 DIAGNOSIS — E782 Mixed hyperlipidemia: Secondary | ICD-10-CM | POA: Diagnosis not present

## 2016-10-03 DIAGNOSIS — I951 Orthostatic hypotension: Secondary | ICD-10-CM | POA: Diagnosis not present

## 2016-10-08 DIAGNOSIS — I10 Essential (primary) hypertension: Secondary | ICD-10-CM | POA: Diagnosis not present

## 2016-10-08 DIAGNOSIS — I951 Orthostatic hypotension: Secondary | ICD-10-CM | POA: Diagnosis not present

## 2016-10-30 DIAGNOSIS — I739 Peripheral vascular disease, unspecified: Secondary | ICD-10-CM | POA: Diagnosis not present

## 2016-10-30 DIAGNOSIS — G622 Polyneuropathy due to other toxic agents: Secondary | ICD-10-CM | POA: Diagnosis not present

## 2016-10-30 DIAGNOSIS — I10 Essential (primary) hypertension: Secondary | ICD-10-CM | POA: Diagnosis not present

## 2016-10-30 DIAGNOSIS — F1010 Alcohol abuse, uncomplicated: Secondary | ICD-10-CM | POA: Diagnosis not present

## 2016-11-01 DIAGNOSIS — I10 Essential (primary) hypertension: Secondary | ICD-10-CM | POA: Diagnosis not present

## 2016-11-04 ENCOUNTER — Encounter: Payer: Self-pay | Admitting: Internal Medicine

## 2016-11-04 ENCOUNTER — Ambulatory Visit: Payer: Self-pay | Admitting: *Deleted

## 2016-11-04 ENCOUNTER — Ambulatory Visit (INDEPENDENT_AMBULATORY_CARE_PROVIDER_SITE_OTHER): Payer: Medicare HMO | Admitting: Internal Medicine

## 2016-11-04 VITALS — BP 124/80 | HR 58 | Temp 98.1°F | Wt 229.0 lb

## 2016-11-04 DIAGNOSIS — R490 Dysphonia: Secondary | ICD-10-CM
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
## 2016-11-04 NOTE — Telephone Encounter (Signed)
You can add him on today at 4:45 if he wants to come in (and cancel the other one)

## 2016-11-04 NOTE — Assessment & Plan Note (Signed)
Without acute respiratory illness Had transient ear pain No oral lesions and no adenopathy Will set up with ENT for good exam and direct laryngoscopy (in this ongoing smoker)

## 2016-11-04 NOTE — Telephone Encounter (Signed)
   Answer Assessment - Initial Assessment Questions 1. LOCATION: "Which ear is involved?"     Lt ear 2. ONSET: "When did the ear start hurting"      Last night 3. SEVERITY: "How bad is the pain?"  (Scale 1-10; mild, moderate or severe)   - MILD (1-3): doesn't interfere with normal activities    - MODERATE (4-7): interferes with normal activities or awakens from sleep    - SEVERE (8-10): excruciating pain, unable to do any normal activities      8 4. URI SYMPTOMS: " Do you have a runny nose or cough?"     yes 5. FEVER: "Do you have a fever?" If so, ask: "What is your temperature, how was it measured, and when did it start?"     no 6. CAUSE: "Have you been swimming recently?", "How often do you use Q-TIPS?", "Have you had any recent air travel or scuba diving?"     no 7. OTHER SYMPTOMS: "Do you have any other symptoms?" (e.g., headache, stiff neck, dizziness, vomiting, runny nose, decreased hearing)     no 8. PREGNANCY: "Is there any chance you are pregnant?" "When was your last menstrual period?"     na  Protocols used: EARACHE-A-AH

## 2016-11-06 ENCOUNTER — Ambulatory Visit: Payer: Medicare HMO | Admitting: Internal Medicine

## 2016-11-08 DIAGNOSIS — H9202 Otalgia, left ear: Secondary | ICD-10-CM | POA: Diagnosis not present

## 2016-11-08 DIAGNOSIS — J381 Polyp of vocal cord and larynx: Secondary | ICD-10-CM | POA: Diagnosis not present

## 2016-11-08 DIAGNOSIS — K219 Gastro-esophageal reflux disease without esophagitis: Secondary | ICD-10-CM | POA: Diagnosis not present

## 2016-12-02 DIAGNOSIS — K219 Gastro-esophageal reflux disease without esophagitis: Secondary | ICD-10-CM | POA: Diagnosis not present

## 2016-12-02 DIAGNOSIS — J381 Polyp of vocal cord and larynx: Secondary | ICD-10-CM | POA: Diagnosis not present

## 2017-01-30 DIAGNOSIS — I6523 Occlusion and stenosis of bilateral carotid arteries: Secondary | ICD-10-CM | POA: Diagnosis not present

## 2017-02-03 ENCOUNTER — Encounter: Payer: Self-pay | Admitting: Internal Medicine

## 2017-02-26 ENCOUNTER — Ambulatory Visit (INDEPENDENT_AMBULATORY_CARE_PROVIDER_SITE_OTHER): Payer: Medicare HMO | Admitting: Internal Medicine

## 2017-02-26 ENCOUNTER — Encounter: Payer: Self-pay | Admitting: Internal Medicine

## 2017-02-26 VITALS — BP 134/70 | HR 68 | Temp 98.1°F | Ht 68.0 in | Wt 239.0 lb

## 2017-02-26 DIAGNOSIS — E785 Hyperlipidemia, unspecified: Secondary | ICD-10-CM

## 2017-02-26 DIAGNOSIS — Z0000 Encounter for general adult medical examination without abnormal findings: Secondary | ICD-10-CM

## 2017-02-26 DIAGNOSIS — Z7189 Other specified counseling: Secondary | ICD-10-CM

## 2017-02-26 DIAGNOSIS — N138 Other obstructive and reflux uropathy: Secondary | ICD-10-CM | POA: Diagnosis not present

## 2017-02-26 DIAGNOSIS — N401 Enlarged prostate with lower urinary tract symptoms: Secondary | ICD-10-CM | POA: Diagnosis not present

## 2017-02-26 DIAGNOSIS — I6523 Occlusion and stenosis of bilateral carotid arteries: Secondary | ICD-10-CM

## 2017-02-26 DIAGNOSIS — I739 Peripheral vascular disease, unspecified: Secondary | ICD-10-CM | POA: Diagnosis not present

## 2017-02-26 LAB — COMPREHENSIVE METABOLIC PANEL
ALBUMIN: 3.9 g/dL (ref 3.5–5.2)
ALT: 21 U/L (ref 0–53)
AST: 19 U/L (ref 0–37)
Alkaline Phosphatase: 62 U/L (ref 39–117)
BUN: 13 mg/dL (ref 6–23)
CALCIUM: 10.1 mg/dL (ref 8.4–10.5)
CHLORIDE: 102 meq/L (ref 96–112)
CO2: 31 meq/L (ref 19–32)
CREATININE: 1.08 mg/dL (ref 0.40–1.50)
GFR: 71.63 mL/min (ref >60.00)
Glucose, Bld: 99 mg/dL (ref 70–99)
POTASSIUM: 4.1 meq/L (ref 3.5–5.1)
Sodium: 138 mEq/L (ref 135–145)
Total Bilirubin: 0.6 mg/dL (ref 0.2–1.2)
Total Protein: 7.1 g/dL (ref 6.0–8.3)

## 2017-02-26 LAB — LIPID PANEL
CHOL/HDL RATIO: 3
CHOLESTEROL: 145 mg/dL (ref 0–200)
HDL: 47.4 mg/dL (ref >39.00)
NONHDL: 98.04
Triglycerides: 207 mg/dL — ABNORMAL HIGH (ref 0.0–149.0)
VLDL: 41.4 mg/dL — ABNORMAL HIGH (ref 0.0–40.0)

## 2017-02-26 LAB — CBC
HEMATOCRIT: 39.2 % (ref 39.0–52.0)
HEMOGLOBIN: 13.6 g/dL (ref 13.0–17.0)
MCHC: 34.7 g/dL (ref 30.0–36.0)
MCV: 100.3 fl — ABNORMAL HIGH (ref 78.0–100.0)
PLATELETS: 192 10*3/uL (ref 150.0–400.0)
RBC: 3.91 Mil/uL — AB (ref 4.22–5.81)
RDW: 12.1 % (ref 11.5–15.5)
WBC: 5.3 10*3/uL (ref 4.0–10.5)

## 2017-02-26 LAB — LDL CHOLESTEROL, DIRECT: LDL DIRECT: 64 mg/dL

## 2017-02-26 NOTE — Assessment & Plan Note (Signed)
Chronic foot pain Stopped smoking!! On statin

## 2017-02-26 NOTE — Assessment & Plan Note (Signed)
Mild symptoms persist Not sure doxazosin is helping---okay to try without it
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
## 2017-02-26 NOTE — Assessment & Plan Note (Signed)
I have personally reviewed the Medicare Annual Wellness questionnaire and have noted 1. The patient's medical and social history 2. Their use of alcohol, tobacco or illicit drugs 3. Their current medications and supplements 4. The patient's functional ability including ADL's, fall risks, home safety risks and hearing or visual             impairment. 5. Diet and physical activities 6. Evidence for depression or mood disorders  The patients weight, height, BMI and visual acuity have been recorded in the chart I have made referrals, counseling and provided education to the patient based review of the above and I have provided the pt with a written personalized care plan for preventive services.  I have provided you with a copy of your personalized plan for preventive services. Please take the time to review along with your updated medication list.  No PSA due to age Colon due 2024 Prefers no flu or shingrix vaccines Discussed exercise

## 2017-02-26 NOTE — Assessment & Plan Note (Signed)
Kind of doesn't want resuscitation but doesn't want DNR yet

## 2017-02-26 NOTE — Assessment & Plan Note (Signed)
Recent ultrasound doesn't show progession

## 2017-02-26 NOTE — Progress Notes (Signed)
Hearing Screening   125Hz  250Hz  500Hz  1000Hz  2000Hz  3000Hz  4000Hz  6000Hz  8000Hz   Right ear:   20 20 20   40    Left ear:   20 40 20  0      Visual Acuity Screening   Right eye Left eye Both eyes  Without correction: 20/20 20/20 20/15   With correction:

## 2017-02-26 NOTE — Assessment & Plan Note (Signed)
No problems with statin 

## 2017-04-02 ENCOUNTER — Other Ambulatory Visit: Payer: Self-pay | Admitting: Internal Medicine

## 2017-04-02 DIAGNOSIS — H6122 Impacted cerumen, left ear: Secondary | ICD-10-CM | POA: Diagnosis not present

## 2017-04-02 DIAGNOSIS — H60392 Other infective otitis externa, left ear: Secondary | ICD-10-CM | POA: Diagnosis not present

## 2017-04-02 DIAGNOSIS — J381 Polyp of vocal cord and larynx: Secondary | ICD-10-CM | POA: Diagnosis not present

## 2017-04-02 DIAGNOSIS — K219 Gastro-esophageal reflux disease without esophagitis: Secondary | ICD-10-CM | POA: Diagnosis not present

## 2017-04-09 ENCOUNTER — Other Ambulatory Visit: Payer: Self-pay | Admitting: Internal Medicine

## 2017-06-23 ENCOUNTER — Other Ambulatory Visit: Payer: Self-pay | Admitting: Internal Medicine

## 2017-07-29 DIAGNOSIS — I6523 Occlusion and stenosis of bilateral carotid arteries: Secondary | ICD-10-CM | POA: Diagnosis not present

## 2017-08-07 DIAGNOSIS — I739 Peripheral vascular disease, unspecified: Secondary | ICD-10-CM | POA: Diagnosis not present

## 2017-08-07 DIAGNOSIS — G622 Polyneuropathy due to other toxic agents: Secondary | ICD-10-CM | POA: Diagnosis not present

## 2017-08-07 DIAGNOSIS — I951 Orthostatic hypotension: Secondary | ICD-10-CM | POA: Diagnosis not present

## 2017-08-07 DIAGNOSIS — E782 Mixed hyperlipidemia: Secondary | ICD-10-CM | POA: Diagnosis not present

## 2017-08-07 DIAGNOSIS — I10 Essential (primary) hypertension: Secondary | ICD-10-CM | POA: Diagnosis not present

## 2017-08-07 DIAGNOSIS — F1010 Alcohol abuse, uncomplicated: Secondary | ICD-10-CM | POA: Diagnosis not present

## 2017-08-07 DIAGNOSIS — E559 Vitamin D deficiency, unspecified: Secondary | ICD-10-CM | POA: Diagnosis not present

## 2017-08-07 DIAGNOSIS — R5381 Other malaise: Secondary | ICD-10-CM | POA: Diagnosis not present

## 2017-09-09 DIAGNOSIS — I739 Peripheral vascular disease, unspecified: Secondary | ICD-10-CM | POA: Diagnosis not present

## 2017-09-09 DIAGNOSIS — I10 Essential (primary) hypertension: Secondary | ICD-10-CM | POA: Diagnosis not present

## 2017-09-09 DIAGNOSIS — E559 Vitamin D deficiency, unspecified: Secondary | ICD-10-CM | POA: Diagnosis not present

## 2017-09-09 DIAGNOSIS — F1010 Alcohol abuse, uncomplicated: Secondary | ICD-10-CM | POA: Diagnosis not present

## 2017-11-21 ENCOUNTER — Encounter: Payer: Self-pay | Admitting: Internal Medicine

## 2017-11-21 ENCOUNTER — Ambulatory Visit (INDEPENDENT_AMBULATORY_CARE_PROVIDER_SITE_OTHER): Payer: Medicare HMO | Admitting: Internal Medicine

## 2017-11-21 VITALS — BP 122/70 | HR 65 | Temp 98.3°F | Ht 68.0 in | Wt 252.0 lb

## 2017-11-21 DIAGNOSIS — B029 Zoster without complications: Secondary | ICD-10-CM | POA: Diagnosis not present

## 2017-11-21 DIAGNOSIS — B019 Varicella without complication: Secondary | ICD-10-CM | POA: Diagnosis not present

## 2017-11-21 MED ORDER — TRIAMCINOLONE ACETONIDE 0.1 % EX CREA: 1.0000 "application " | TOPICAL_CREAM | Freq: Two times a day (BID) | CUTANEOUS | 1 refills | Status: DC | PRN

## 2017-11-21 MED ORDER — VALACYCLOVIR HCL 1 G PO TABS: 1000.0000 mg | ORAL_TABLET | Freq: Three times a day (TID) | ORAL | 0 refills | Status: DC

## 2017-11-21 NOTE — Assessment & Plan Note (Signed)
Some crusting already but having more itching, etc Will go ahead with valacyclovir for a week Cortisone cream for itching

## 2017-11-21 NOTE — Patient Instructions (Signed)
I would recommend you get the shingrix vaccine at the pharmacy---but wait a few months.
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
## 2017-12-26 ENCOUNTER — Encounter: Payer: Self-pay | Admitting: Internal Medicine

## 2017-12-26 ENCOUNTER — Ambulatory Visit (INDEPENDENT_AMBULATORY_CARE_PROVIDER_SITE_OTHER)
Admission: RE | Admit: 2017-12-26 | Discharge: 2017-12-26 | Disposition: A | Discharge status: Home / Self Care | Payer: Medicare HMO | Source: Ambulatory Visit | Attending: Internal Medicine | Admitting: Internal Medicine

## 2017-12-26 ENCOUNTER — Ambulatory Visit: Payer: Medicare HMO | Admitting: Internal Medicine

## 2017-12-26 VITALS — BP 126/70 | HR 74 | Ht 68.0 in | Wt 247.5 lb

## 2017-12-26 DIAGNOSIS — K5900 Constipation, unspecified: Secondary | ICD-10-CM

## 2017-12-26 NOTE — Progress Notes (Signed)
Xray is ok  Hopefully MiraLax purge will make a difference and reset his stool pattern.  May take a while  F/U Prn
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
## 2017-12-26 NOTE — Patient Instructions (Addendum)
If you are age 71 or older, your body mass index should be between 23-30. Your Body mass index is 37.63 kg/m. If this is out of the aforementioned range listed, please consider follow up with your Primary Care Provider.  Go to the basement before leaving and get an x-ray for your abdomen done.   Dr Carlean Purl recommends that you complete a bowel purge (to clean out your bowels). Please do the following: Purchase a bottle of Miralax over the counter as well as a box of 5 mg dulcolax tablets. Take 4 dulcolax tablets. Wait 1 hour. You will then drink 6-8 capfuls of Miralax mixed in an adequate amount of water/juice/gatorade (you may choose which of these liquids to drink) over the next 2-3 hours. You should expect results within 1 to 6 hours after completing the bowel purge.   Call us back if not better.   I appreciate the opportunity to care for you. Silvano Rusk, MD, [facility]

## 2018-01-22 DIAGNOSIS — L905 Scar conditions and fibrosis of skin: Secondary | ICD-10-CM | POA: Diagnosis not present

## 2018-01-22 DIAGNOSIS — L300 Nummular dermatitis: Secondary | ICD-10-CM | POA: Diagnosis not present

## 2018-01-29 DIAGNOSIS — I6523 Occlusion and stenosis of bilateral carotid arteries: Secondary | ICD-10-CM | POA: Diagnosis not present

## 2018-02-03 ENCOUNTER — Encounter: Payer: Self-pay | Admitting: Internal Medicine

## 2018-02-03 DIAGNOSIS — I779 Disorder of arteries and arterioles, unspecified: Secondary | ICD-10-CM | POA: Insufficient documentation

## 2018-02-03 DIAGNOSIS — I739 Peripheral vascular disease, unspecified: Secondary | ICD-10-CM

## 2018-02-13 ENCOUNTER — Ambulatory Visit (INDEPENDENT_AMBULATORY_CARE_PROVIDER_SITE_OTHER): Payer: Medicare HMO | Admitting: Internal Medicine

## 2018-02-13 ENCOUNTER — Encounter: Payer: Self-pay | Admitting: Internal Medicine

## 2018-02-13 VITALS — BP 118/80 | HR 69 | Temp 97.8°F | Resp 18 | Ht 68.0 in | Wt 250.0 lb

## 2018-02-13 DIAGNOSIS — J019 Acute sinusitis, unspecified: Secondary | ICD-10-CM | POA: Diagnosis not present

## 2018-02-13 MED ORDER — AMOXICILLIN 500 MG PO TABS: 1000.0000 mg | ORAL_TABLET | Freq: Two times a day (BID) | ORAL | 0 refills | Status: AC

## 2018-02-13 NOTE — Assessment & Plan Note (Signed)
Sick for 3 weeks Not in chest Will treat with amoxil Discussed supportive care
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
## 2018-02-23 ENCOUNTER — Telehealth: Payer: Self-pay | Admitting: Internal Medicine

## 2018-02-23 MED ORDER — AMOXICILLIN-POT CLAVULANATE 875-125 MG PO TABS: 1.0000 | ORAL_TABLET | Freq: Two times a day (BID) | ORAL | 0 refills | Status: DC

## 2018-02-23 NOTE — Telephone Encounter (Signed)
Spoke to pt's wife per DPR. ?

## 2018-02-23 NOTE — Telephone Encounter (Signed)
Pt's wife called office in regards to the pt being seen on 2/7 for stuffy ears. Pt was told by [facility] that it he needs another refill sent in for the antibiotic to call. Pt is requesting a refill on the amoxicillin [facility] [place]. Pt is requesting a call once the Rx has been sent. Best cb # [phone]

## 2018-02-23 NOTE — Telephone Encounter (Signed)
Please let him know that since he was still having symptoms, I sent in a prescription for a stronger antibiotic

## 2018-02-27 ENCOUNTER — Ambulatory Visit (INDEPENDENT_AMBULATORY_CARE_PROVIDER_SITE_OTHER): Payer: Medicare HMO | Admitting: Internal Medicine

## 2018-02-27 ENCOUNTER — Encounter: Payer: Self-pay | Admitting: Internal Medicine

## 2018-02-27 VITALS — BP 122/82 | HR 63 | Temp 98.4°F | Ht 68.0 in | Wt 249.0 lb

## 2018-02-27 DIAGNOSIS — I6523 Occlusion and stenosis of bilateral carotid arteries: Secondary | ICD-10-CM | POA: Diagnosis not present

## 2018-02-27 DIAGNOSIS — I10 Essential (primary) hypertension: Secondary | ICD-10-CM

## 2018-02-27 DIAGNOSIS — N138 Other obstructive and reflux uropathy: Secondary | ICD-10-CM

## 2018-02-27 DIAGNOSIS — Z0000 Encounter for general adult medical examination without abnormal findings: Secondary | ICD-10-CM | POA: Diagnosis not present

## 2018-02-27 DIAGNOSIS — I739 Peripheral vascular disease, unspecified: Secondary | ICD-10-CM | POA: Diagnosis not present

## 2018-02-27 DIAGNOSIS — Z7189 Other specified counseling: Secondary | ICD-10-CM | POA: Diagnosis not present

## 2018-02-27 DIAGNOSIS — N401 Enlarged prostate with lower urinary tract symptoms: Secondary | ICD-10-CM | POA: Diagnosis not present

## 2018-02-27 LAB — LIPID PANEL
Cholesterol: 134 mg/dL (ref 0–200)
HDL: 45.4 mg/dL (ref >39.00)
LDL CALC: 55 mg/dL (ref 0–99)
NonHDL: 88.16
Total CHOL/HDL Ratio: 3
Triglycerides: 168 mg/dL — ABNORMAL HIGH (ref 0.0–149.0)
VLDL: 33.6 mg/dL (ref 0.0–40.0)

## 2018-02-27 LAB — COMPREHENSIVE METABOLIC PANEL
ALT: 26 U/L (ref 0–53)
AST: 21 U/L (ref 0–37)
Albumin: 4.3 g/dL (ref 3.5–5.2)
Alkaline Phosphatase: 63 U/L (ref 39–117)
BUN: 15 mg/dL (ref 6–23)
CO2: 31 mEq/L (ref 19–32)
Calcium: 10.4 mg/dL (ref 8.4–10.5)
Chloride: 104 mEq/L (ref 96–112)
Creatinine, Ser: 0.99 mg/dL (ref 0.40–1.50)
GFR: 74.3 mL/min (ref >60.00)
Glucose, Bld: 95 mg/dL (ref 70–99)
Potassium: 4.2 mEq/L (ref 3.5–5.1)
Sodium: 142 mEq/L (ref 135–145)
Total Bilirubin: 0.5 mg/dL (ref 0.2–1.2)
Total Protein: 7.2 g/dL (ref 6.0–8.3)

## 2018-02-27 LAB — CBC
HCT: 40.3 % (ref 39.0–52.0)
Hemoglobin: 13.8 g/dL (ref 13.0–17.0)
MCHC: 34.2 g/dL (ref 30.0–36.0)
MCV: 101.8 fl — ABNORMAL HIGH (ref 78.0–100.0)
Platelets: 203 10*3/uL (ref 150.0–400.0)
RBC: 3.96 Mil/uL — ABNORMAL LOW (ref 4.22–5.81)
RDW: 12.2 % (ref 11.5–15.5)
WBC: 6.6 10*3/uL (ref 4.0–10.5)

## 2018-02-27 NOTE — Assessment & Plan Note (Signed)
Ongoing stable symptoms On doxazosin

## 2018-02-27 NOTE — Assessment & Plan Note (Signed)
BP Readings from Last 3 Encounters:  02/27/18 122/82  02/13/18 118/80  12/26/17 126/70   Good control

## 2018-02-27 NOTE — Progress Notes (Signed)
Hearing Screening   Method: Audiometry   125Hz  250Hz  500Hz  1000Hz  2000Hz  3000Hz  4000Hz  6000Hz  8000Hz   Right ear:   25 25 20   0    Left ear:   20 20 20  20       Visual Acuity Screening   Right eye Left eye Both eyes  Without correction: 20/15 20/15 20/13   With correction:
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
## 2018-02-27 NOTE — Assessment & Plan Note (Signed)
I have personally reviewed the Medicare Annual Wellness questionnaire and have noted 1. The patient's medical and social history 2. Their use of alcohol, tobacco or illicit drugs 3. Their current medications and supplements 4. The patient's functional ability including ADL's, fall risks, home safety risks and hearing or visual             impairment. 5. Diet and physical activities 6. Evidence for depression or mood disorders  The patients weight, height, BMI and visual acuity have been recorded in the chart I have made referrals, counseling and provided education to the patient based review of the above and I have provided the pt with a written personalized care plan for preventive services.  I have provided you with a copy of your personalized plan for preventive services. Please take the time to review along with your updated medication list.  Still prefers no shingrix or flu vaccines Done with colons No PSA due to age Discussed some exercising

## 2018-02-27 NOTE — Assessment & Plan Note (Signed)
Monitored closely but no symptoms

## 2018-02-27 NOTE — Assessment & Plan Note (Signed)
Severe claudication Discussed some resistance training ASA, statin

## 2018-02-27 NOTE — Assessment & Plan Note (Signed)
See social history 

## 2018-03-20 ENCOUNTER — Other Ambulatory Visit: Payer: Self-pay | Admitting: Cardiology

## 2018-03-20 ENCOUNTER — Telehealth: Payer: Self-pay

## 2018-03-20 MED ORDER — OLMESARTAN MEDOXOMIL-HCTZ 20-12.5 MG PO TABS: 1.0000 | ORAL_TABLET | ORAL | 1 refills | Status: DC

## 2018-03-20 NOTE — Telephone Encounter (Signed)
Losartan HCT not available, Benicar 20/12.5 mg Rx

## 2018-03-20 NOTE — Telephone Encounter (Signed)
Spouse Rod Holler called stating that Losartan on recall. I called pharmacy to verify and they said that it is on backorder both plain and combos. Please advise.//ah

## 2018-03-23 ENCOUNTER — Other Ambulatory Visit: Payer: Self-pay

## 2018-03-23 NOTE — Telephone Encounter (Signed)
LMOM advising of change from Losartan due to backorder to Benicar 20-12.5 which has been sent to pharmacy.//ah

## 2018-04-07 ENCOUNTER — Other Ambulatory Visit: Payer: Self-pay | Admitting: Internal Medicine

## 2018-04-24 ENCOUNTER — Other Ambulatory Visit: Payer: Self-pay | Admitting: Cardiology

## 2018-04-24 DIAGNOSIS — I6523 Occlusion and stenosis of bilateral carotid arteries: Secondary | ICD-10-CM

## 2018-04-27 ENCOUNTER — Other Ambulatory Visit: Payer: Self-pay | Admitting: Internal Medicine

## 2018-06-25 DIAGNOSIS — L578 Other skin changes due to chronic exposure to nonionizing radiation: Secondary | ICD-10-CM | POA: Diagnosis not present

## 2018-06-25 DIAGNOSIS — L905 Scar conditions and fibrosis of skin: Secondary | ICD-10-CM | POA: Diagnosis not present

## 2018-06-25 DIAGNOSIS — C44629 Squamous cell carcinoma of skin of left upper limb, including shoulder: Secondary | ICD-10-CM | POA: Diagnosis not present

## 2018-06-25 DIAGNOSIS — D485 Neoplasm of uncertain behavior of skin: Secondary | ICD-10-CM | POA: Diagnosis not present

## 2018-07-01 ENCOUNTER — Other Ambulatory Visit: Payer: Self-pay

## 2018-07-01 DIAGNOSIS — E7800 Pure hypercholesterolemia, unspecified: Secondary | ICD-10-CM

## 2018-07-01 MED ORDER — ROSUVASTATIN CALCIUM 20 MG PO TABS: 20.0000 mg | ORAL_TABLET | Freq: Every day | ORAL | 3 refills | Status: DC

## 2018-07-13 DIAGNOSIS — Z85828 Personal history of other malignant neoplasm of skin: Secondary | ICD-10-CM | POA: Diagnosis not present

## 2018-07-13 DIAGNOSIS — L300 Nummular dermatitis: Secondary | ICD-10-CM | POA: Diagnosis not present

## 2018-07-20 ENCOUNTER — Other Ambulatory Visit: Payer: Self-pay | Admitting: Internal Medicine

## 2018-07-27 DIAGNOSIS — L98499 Non-pressure chronic ulcer of skin of other sites with unspecified severity: Secondary | ICD-10-CM | POA: Diagnosis not present

## 2018-07-27 DIAGNOSIS — Z85828 Personal history of other malignant neoplasm of skin: Secondary | ICD-10-CM | POA: Diagnosis not present

## 2018-07-30 ENCOUNTER — Ambulatory Visit (INDEPENDENT_AMBULATORY_CARE_PROVIDER_SITE_OTHER): Payer: Medicare HMO

## 2018-07-30 ENCOUNTER — Other Ambulatory Visit: Payer: Self-pay

## 2018-07-30 DIAGNOSIS — I6523 Occlusion and stenosis of bilateral carotid arteries: Secondary | ICD-10-CM

## 2018-08-02 ENCOUNTER — Other Ambulatory Visit: Payer: Self-pay | Admitting: Cardiology

## 2018-08-02 DIAGNOSIS — I6523 Occlusion and stenosis of bilateral carotid arteries: Secondary | ICD-10-CM

## 2018-08-03 NOTE — Progress Notes (Signed)
Pt aware.

## 2018-09-11 ENCOUNTER — Ambulatory Visit: Payer: Medicare HMO | Admitting: Cardiology

## 2018-10-09 ENCOUNTER — Other Ambulatory Visit: Payer: Self-pay

## 2018-10-09 NOTE — Telephone Encounter (Signed)
Wife called in for refills. Is he supposed to be taking both Amlodipine and Olmesartan- HCTZ? Please advise.

## 2018-10-12 NOTE — Telephone Encounter (Signed)
Yes

## 2018-10-12 NOTE — Telephone Encounter (Signed)
He is allergic to [place] and tolerating Olemsartan, This was Rx by PCP so should come from him You can refill Amlodipine

## 2018-10-15 ENCOUNTER — Telehealth: Payer: Self-pay

## 2018-10-15 MED ORDER — AMLODIPINE BESYLATE 5 MG PO TABS: 5.0000 mg | ORAL_TABLET | Freq: Every day | ORAL | 3 refills | Status: DC

## 2018-10-15 NOTE — Telephone Encounter (Signed)
Advised patient to call PCP and discuss refilling Olmesartan-HCTZ.

## 2018-10-16 ENCOUNTER — Encounter: Payer: Self-pay | Admitting: Cardiology

## 2018-10-16 ENCOUNTER — Other Ambulatory Visit: Payer: Self-pay

## 2018-10-16 ENCOUNTER — Ambulatory Visit (INDEPENDENT_AMBULATORY_CARE_PROVIDER_SITE_OTHER): Payer: Medicare HMO | Admitting: Cardiology

## 2018-10-16 VITALS — BP 137/75 | HR 69 | Temp 96.4°F | Ht 68.0 in | Wt 260.4 lb

## 2018-10-16 DIAGNOSIS — I6523 Occlusion and stenosis of bilateral carotid arteries: Secondary | ICD-10-CM | POA: Diagnosis not present

## 2018-10-16 DIAGNOSIS — I10 Essential (primary) hypertension: Secondary | ICD-10-CM | POA: Diagnosis not present

## 2018-10-16 DIAGNOSIS — E7800 Pure hypercholesterolemia, unspecified: Secondary | ICD-10-CM | POA: Diagnosis not present

## 2018-10-16 DIAGNOSIS — I739 Peripheral vascular disease, unspecified: Secondary | ICD-10-CM | POA: Diagnosis not present

## 2018-10-16 MED ORDER — AMLODIPINE BESYLATE 5 MG PO TABS: 5.0000 mg | ORAL_TABLET | Freq: Every day | ORAL | 3 refills | Status: DC

## 2018-10-16 MED ORDER — ROSUVASTATIN CALCIUM 20 MG PO TABS: 20.0000 mg | ORAL_TABLET | Freq: Every day | ORAL | 3 refills | Status: DC

## 2018-10-16 MED ORDER — OLMESARTAN MEDOXOMIL-HCTZ 20-12.5 MG PO TABS: 1.0000 | ORAL_TABLET | ORAL | 3 refills | Status: DC

## 2018-10-16 NOTE — Progress Notes (Signed)
Primary Physician/Referring:  [name], MD  Patient ID: Brian Clarke, male    DOB: October 07, 1945, 73 y.o.   MRN: [id number]  Chief Complaint  Patient presents with   Coronary Artery Disease   Hypertension   Follow-up    1 year   HPI:    Brian Clarke  is a 73 y.o.  peripheral artery disease, peripheral neuropathy related to alcohol, tobacco use disorder (quit in Jan 2019). Asymptomatic bilateral carotid artery stenosis, hyperlipidemia.  He has history of PTA of left SFA on 09/07/2013. Repeat angiography on 08/29/2015 revealed no significant change and patent angioplasty site below the left knee he had one vessel runoff and right leg showed mild to moderate diffuese disease and 3 vessel r/o.  With regard to claudication, continues to have worsening symptoms in bilateral feet and in his calves, left leg calf cladudication is worse than right,  unable to walk through claudication after walking for 3-400 feet. No ulceration. Symptoms worse since his last OV a year ago. He mostly reports burning sensation in his feet that is chronic. No chest pain or shortness of breath. Has reduced alcohol from I pint of liquor a day to one drink a day and  has recently quit smoking in January 2019 but has been using Vapor.  Past Medical History:  Diagnosis Date   Allergy    Anxiety    Arthritis    thumbs    BPH (benign prostatic hypertrophy)    Carotid artery disease ([place]) 2019 report   <50% on left, 50-69% on right   Carotid disease, bilateral ([place])    Hyperlipidemia    Hypertension    Personal history of colonic polyps 10/06/2006   PVD (peripheral vascular disease) ([place])    Left SFA- 9/1/20150 angioplasty , right SFA- mild to moderate disease, Carotids- 50% stenosis right proximal ICA    Past Surgical History:  Procedure Laterality Date   ANGIOPLASTY  09/07/2013    SFA wtih drug coated balloon    [place]  1953 or so   severe   carotid doppler study   06/2014    blockage in  carotid arteries followed by Dr Einar Gip    CERVICAL DISC SURGERY  ~1990   COLONOSCOPY     CYSTOSCOPY WITH RETROGRADE PYELOGRAM, URETEROSCOPY AND STENT PLACEMENT Left 07/01/2014   Procedure: CYSTOSCOPY WITH bilateral RETROGRADE PYELOGRAM,  left URETEROSCOPY, left STENT PLACEMENT ;  Surgeon: Alexis Frock, MD;  Location: [facility];  Service: Urology;  Laterality: Left;   HOLMIUM LASER APPLICATION Left [id number]   Procedure: HOLMIUM LASER APPLICATION;  Surgeon: Alexis Frock, MD;  Location: [facility];  Service: Urology;  Laterality: Left;   LOWER EXTREMITY ANGIOGRAM N/A 09/07/2013   Procedure: LOWER EXTREMITY ANGIOGRAM;  Surgeon: [name], MD;  Location: [facility] CATH LAB;  Service: Cardiovascular;  Laterality: N/A;   PERIPHERAL VASCULAR CATHETERIZATION N/A 08/29/2015   Procedure: Lower Extremity Angiography;  Surgeon: [name], MD;  Location: [facility];  Service: Cardiovascular;  Laterality: N/A;   POLYPECTOMY     Social History   Socioeconomic History   Marital status: Married    Spouse name: Not on file   Number of children: 2   Years of education: Not on file   Highest education level: Not on file  Occupational History   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   Occupation: Gaffer    Comment: Part time  Scientist, product/process development strain: Not  on file   Food insecurity    Worry: Not on file    Inability: Not on file   Transportation needs    Medical: Not on file    Non-medical: Not on file  Tobacco Use   Smoking status: Former Smoker    Packs/day: 1.00    Years: 58.00    Pack years: 58.00    Types: Cigarettes    Quit date: 01/07/2017    Years since quitting: 1.7   Smokeless tobacco: Never Used  Substance and Sexual Activity   Alcohol use: Yes    Alcohol/week: 21.0 standard drinks    Types: 21 Standard drinks or equivalent per week    Comment: 1/2 gallon per week of rum x 35 years -3 a day     Drug use: No    Comment: hx of marijuana use years ago    Sexual activity: Not on file  Lifestyle   Physical activity    Days per week: Not on file    Minutes per session: Not on file   Stress: Not on file  Relationships   Social connections    Talks on phone: Not on file    Gets together: Not on file    Attends religious service: Not on file    Active member of club or organization: Not on file    Attends meetings of clubs or organizations: Not on file    Relationship status: Not on file   Intimate partner violence    Fear of current or ex partner: Not on file    Emotionally abused: Not on file    Physically abused: Not on file    Forced sexual activity: Not on file  Other Topics Concern   Not on file  Social History Narrative   No living will   No health care POA but requests wife--no clear alternate (probably sons)   Not sure about DNR---will accept resuscitation for now.    No tube feeds if cognitively unaware   ROS  Review of Systems  Constitution: Positive for malaise/fatigue. Negative for chills, decreased appetite and weight gain.  Cardiovascular: Positive for claudication and irregular heartbeat. Negative for dyspnea on exertion, leg swelling and syncope.  Respiratory: Positive for cough (chronic).   Endocrine: Negative for cold intolerance.  Hematologic/Lymphatic: Does not bruise/bleed easily.  Musculoskeletal: Positive for muscle weakness. Negative for joint swelling.  Gastrointestinal: Negative for abdominal pain, anorexia, change in bowel habit, hematochezia and melena.  Neurological: Negative for headaches and light-headedness.  Psychiatric/Behavioral: Negative for depression and substance abuse.  All other systems reviewed and are negative.  Objective   Vitals with BMI 10/16/2018 02/27/2018 02/13/2018  Height 5\' 8"  5\' 8"  5\' 8"   Weight 260 lbs 6 oz 249 lbs 250 lbs  BMI 39.6 [id number]  Systolic 0000000 [id number]  Diastolic 75 82 80  Pulse 69 63 69      Blood pressure 137/75, pulse 69, temperature (!) 96.4 F (35.8 C), height 5\' 8"  (1.727 m), weight 260 lb 6.4 oz (118.1 kg), SpO2 96 %. Body mass index is 39.59 kg/m.   Physical Exam  Constitutional:  Well-built and moderately obese in no acute distress.  HENT:  Head: Atraumatic.  Eyes: Conjunctivae are normal.  Neck: Neck supple. No JVD present. No thyromegaly present.  Cardiovascular: Normal rate, regular rhythm and normal heart sounds. Exam reveals no gallop.  No murmur heard. Pulses:      Carotid pulses are 2+ on the right side and 2+ on  the left side.      Radial pulses are 2+ on the right side and 2+ on the left side.       Femoral pulses are 2+ on the right side and 2+ on the left side.      Popliteal pulses are 1+ on the right side and 1+ on the left side.       Dorsalis pedis pulses are 0 on the right side and 0 on the left side.       Posterior tibial pulses are 2+ on the right side and 0 on the left side.  No leg edema, no JVD.  Pulmonary/Chest: Effort normal and breath sounds normal.  Abdominal: Soft. Bowel sounds are normal.  Musculoskeletal: Normal range of motion.  Neurological: He is alert.  Skin: Skin is warm and dry.  Psychiatric: He has a normal mood and affect.   Radiology: No results found.  Laboratory examination:   Recent Labs    02/27/18 1126  NA 142  K 4.2  CL 104  CO2 31  GLUCOSE 95  BUN 15  CREATININE 0.99  CALCIUM 10.4   CMP Latest Ref Rng & Units 02/27/2018 02/26/2017 02/26/2016  Glucose 70 - 99 mg/dL 95 99 93  BUN 6 - 23 mg/dL 15 13 14   Creatinine 0.40 - 1.50 mg/dL 0.99 1.08 0.98  Sodium 135 - 145 mEq/L 142 138 141  Potassium 3.5 - 5.1 mEq/L 4.2 4.1 4.0  Chloride 96 - 112 mEq/L 104 102 106  CO2 19 - 32 mEq/L 31 31 29   Calcium 8.4 - 10.5 mg/dL 10.4 10.1 9.7  Total Protein 6.0 - 8.3 g/dL 7.2 7.1 6.9  Total Bilirubin 0.2 - 1.2 mg/dL 0.5 0.6 0.4  Alkaline Phos 39 - 117 U/L 63 62 61  AST 0 - 37 U/L 21 19 23   ALT 0 - 53 U/L 26 21 30     CBC Latest Ref Rng & Units 02/27/2018 02/26/2017 02/26/2016  WBC 4.0 - 10.5 K/uL 6.6 5.3 7.6  Hemoglobin 13.0 - 17.0 g/dL 13.8 13.6 13.4  Hematocrit 39.0 - 52.0 % 40.3 39.2 39.4  Platelets 150.0 - 400.0 K/uL 203.0 192.0 200.0   Lipid Panel     Component Value Date/Time   CHOL 134 02/27/2018 1126   TRIG 168.0 (H) 02/27/2018 1126   HDL 45.40 02/27/2018 1126   CHOLHDL 3 02/27/2018 1126   VLDL 33.6 02/27/2018 1126   LDLCALC 55 02/27/2018 1126   LDLDIRECT 64.0 02/26/2017 1136   HEMOGLOBIN A1C No results found for: HGBA1C, MPG TSH No results for input(s): TSH in the last 8760 hours. Medications and allergies   Allergies  Allergen Reactions   Micardis [Telmisartan] Swelling    Tongue swelling   Tadalafil Other (See Comments)    unknown     Prior to Admission medications   Medication Sig Start Date End Date Taking? Authorizing Provider  amLODipine (NORVASC) 5 MG tablet Take 1 tablet (5 mg total) by mouth daily at 2 PM. 10/15/18  Yes [name], MD  aspirin EC 81 MG tablet Take 81 mg by mouth daily.   Yes [provider]  Cholecalciferol (VITAMIN D3) 5000 units TABS Take 1 tablet by mouth daily.   Yes [provider]  Cyanocobalamin (B-12) 5000 MCG SUBL Place under the tongue.   Yes [provider]  doxazosin (CARDURA) 4 MG tablet TAKE 1 TABLET BY MOUTH DAILY 04/07/18  Yes Viviana Simpler I, MD  metoprolol succinate (TOPROL-XL) 100 MG 24 hr tablet  TAKE 1 TABLET BY MOUTH TWICE DAILY WITH OR IMMEDIATELY FOLLOWING A MEAL 07/20/18  Yes [name], MD  olmesartan-hydrochlorothiazide (BENICAR HCT) 20-12.5 MG tablet Take 1 tablet by mouth every morning. 03/20/18  Yes [name], MD  pyridoxine (B-6) 500 MG tablet Take 1,000 mg by mouth daily.   Yes [provider]  valACYclovir (VALTREX) 1000 MG tablet Take 1 tablet (1,000 mg total) by mouth 3 (three) times daily. 11/21/17  Yes [name], MD     Current Outpatient Medications  Medication  Instructions   amLODipine (NORVASC) 5 mg, Oral, Daily   aspirin EC 81 mg, Oral, Daily   Cholecalciferol (VITAMIN D3) 5000 units TABS 1 tablet, Oral, Daily   Cyanocobalamin (B-12) 5000 MCG SUBL Sublingual   doxazosin (CARDURA) 4 MG tablet TAKE 1 TABLET BY MOUTH DAILY   metoprolol succinate (TOPROL-XL) 100 MG 24 hr tablet TAKE 1 TABLET BY MOUTH TWICE DAILY WITH OR IMMEDIATELY FOLLOWING A MEAL   olmesartan-hydrochlorothiazide (BENICAR HCT) 20-12.5 MG tablet 1 tablet, Oral, BH-each morning   pyridoxine (B-6) 1,000 mg, Oral, Daily   rosuvastatin (CRESTOR) 20 mg, Oral, Daily   valACYclovir (VALTREX) 1,000 mg, Oral, 3 times daily    Cardiac Studies:   Echocardiogram [10/08/2016]: Left ventricle cavity is normal in size. Moderate concentric hypertrophy of the left ventricle. Normal global wall motion. Indeterminate diastolic filling pattern. Calculated EF 52%. Mild (Grade I) aortic regurgitation. Mild (Grade I) mitral regurgitation. Mild tricuspid regurgitation. Pulmonary artery systolic pressure is estimated at 20-25 mm Hg  PV ANGIO [08/29/2015]: Left SFA PTA performed on 09/07/2013 with L[employer]onix drug-eluting balloon widely patent. Below left knee 1 vessel runoff in the form of peroneal artery. Right SFA mild to moderate disease, three-vessel runoff below the right knee, distal AT severe diffuse disease.  Carotid artery duplex  07/30/2018: Stenosis in the right internal carotid artery (50-69%). Stenosis in the left internal carotid artery (16-49%). Stenosis in the left external carotid artery (<50%). Antegrade right vertebral artery flow. Antegrade left vertebral artery flow. Compared to 01/29/18, mild progression in the left ICA stenosis. Follow up in six months is appropriate if clinically indicated.  Assessment     ICD-10-CM   1. Bilateral carotid artery stenosis  I65.23 EKG 12-Lead  2. Claudication in peripheral vascular disease (HCC)  I73.9 PCV LOWER ARTERIAL (BILATERAL)  3.  Essential hypertension  I10 amLODipine (NORVASC) 5 MG tablet    olmesartan-hydrochlorothiazide (BENICAR HCT) 20-12.5 MG tablet  4. Hypercholesteremia  E78.00 rosuvastatin (CRESTOR) 20 MG tablet    EKG 10/17/2018: Marked sinus bradycardia at the rate of 53 bpm, Cannot exclude ectopic atrial rhythm. Normal axis, poor R-wave progression, cannot exclude anteroseptal infarct old.  Low-voltage complexes.  Nonspecific T abnormality.  Recommendations:   He is here on a annual visit and follow-up of peripheral arterial disease, symptoms of claudication of worsening since last office visit where he has to stop after walking for 300 feet.  Left leg claudication is worse.  I will set him up for lower extremity arterial duplex, physical examination is unchanged from previous examination and hence unless the lower extremity arterial duplex is abnormal, would recommend evaluation for pseudo-claudication including peripheral neuropathy.  If the Dopplers abnormal he will need repeat peripheral arteriogram due to lifestyle limiting claudication.  There is no limb threatening ischemia.  There is no tissue loss.  Capillary refill is normal.  Blood pressure is well controlled.  He has severe anaphylactic reaction to telmisartan, however he is tolerating olmesartan without any side  effects which I have refilled.  I have double confirmed that he has been on this medication after discussions with his wife.  Blood pressure is well controlled, lipids are also well controlled, triglycerides elevated due to his poor eating habits.  He will try to make changes regarding the same.  Unless I see him back after the Dopplers, I'll see him back in 6 months for follow-up.  [name], MD, [facility] 10/16/2018, 11:42 AM [facility] [place] Pager: [phone] Office: [phone] If no answer Cell [phone]

## 2018-11-06 ENCOUNTER — Other Ambulatory Visit: Payer: Self-pay

## 2018-11-06 ENCOUNTER — Emergency Department (HOSPITAL_COMMUNITY): Payer: Medicare HMO

## 2018-11-06 ENCOUNTER — Encounter (HOSPITAL_COMMUNITY): Payer: Self-pay | Admitting: Emergency Medicine

## 2018-11-06 ENCOUNTER — Emergency Department (HOSPITAL_COMMUNITY)
Admission: EM | Admit: 2018-11-06 | Discharge: 2018-11-07 | Disposition: A | Discharge status: Home / Self Care | Payer: Medicare HMO | Attending: Emergency Medicine | Admitting: Emergency Medicine

## 2018-11-06 DIAGNOSIS — K760 Fatty (change of) liver, not elsewhere classified: Secondary | ICD-10-CM | POA: Diagnosis not present

## 2018-11-06 DIAGNOSIS — Z20828 Contact with and (suspected) exposure to other viral communicable diseases: Secondary | ICD-10-CM | POA: Insufficient documentation

## 2018-11-06 DIAGNOSIS — K8050 Calculus of bile duct without cholangitis or cholecystitis without obstruction: Secondary | ICD-10-CM | POA: Diagnosis not present

## 2018-11-06 DIAGNOSIS — Z87891 Personal history of nicotine dependence: Secondary | ICD-10-CM | POA: Insufficient documentation

## 2018-11-06 DIAGNOSIS — Z79899 Other long term (current) drug therapy: Secondary | ICD-10-CM | POA: Insufficient documentation

## 2018-11-06 DIAGNOSIS — R112 Nausea with vomiting, unspecified: Secondary | ICD-10-CM

## 2018-11-06 DIAGNOSIS — R0789 Other chest pain: Secondary | ICD-10-CM | POA: Diagnosis not present

## 2018-11-06 DIAGNOSIS — Z7982 Long term (current) use of aspirin: Secondary | ICD-10-CM | POA: Diagnosis not present

## 2018-11-06 DIAGNOSIS — I10 Essential (primary) hypertension: Secondary | ICD-10-CM | POA: Diagnosis not present

## 2018-11-06 DIAGNOSIS — R1013 Epigastric pain: Secondary | ICD-10-CM | POA: Diagnosis not present

## 2018-11-06 DIAGNOSIS — I2510 Atherosclerotic heart disease of native coronary artery without angina pectoris: Secondary | ICD-10-CM | POA: Diagnosis not present

## 2018-11-06 DIAGNOSIS — R079 Chest pain, unspecified: Secondary | ICD-10-CM | POA: Diagnosis not present

## 2018-11-06 DIAGNOSIS — I739 Peripheral vascular disease, unspecified: Secondary | ICD-10-CM | POA: Diagnosis not present

## 2018-11-06 LAB — CBC
HCT: 40.9 % (ref 39.0–52.0)
Hemoglobin: 14.2 g/dL (ref 13.0–17.0)
MCH: 34.9 pg — ABNORMAL HIGH (ref 26.0–34.0)
MCHC: 34.7 g/dL (ref 30.0–36.0)
MCV: 100.5 fL — ABNORMAL HIGH (ref 80.0–100.0)
Platelets: 211 10*3/uL (ref 150–400)
RBC: 4.07 MIL/uL — ABNORMAL LOW (ref 4.22–5.81)
RDW: 11.7 % (ref 11.5–15.5)
WBC: 11 10*3/uL — ABNORMAL HIGH (ref 4.0–10.5)
nRBC: 0 % (ref 0.0–0.2)

## 2018-11-06 LAB — BASIC METABOLIC PANEL
Anion gap: 16 — ABNORMAL HIGH (ref 5–15)
BUN: 16 mg/dL (ref 8–23)
CO2: 22 mmol/L (ref 22–32)
Calcium: 9.7 mg/dL (ref 8.9–10.3)
Chloride: 100 mmol/L (ref 98–111)
Creatinine, Ser: 1.18 mg/dL (ref 0.61–1.24)
GFR calc Af Amer: >60 mL/min (ref >60)
GFR calc non Af Amer: >60 mL/min (ref >60)
Glucose, Bld: 150 mg/dL — ABNORMAL HIGH (ref 70–99)
Potassium: 3.6 mmol/L (ref 3.5–5.1)
Sodium: 138 mmol/L (ref 135–145)

## 2018-11-06 LAB — PROTIME-INR
INR: 1 (ref 0.8–1.2)
Prothrombin Time: 12.8 seconds (ref 11.4–15.2)

## 2018-11-06 LAB — TROPONIN I (HIGH SENSITIVITY): Troponin I (High Sensitivity): 10 ng/L (ref <18)

## 2018-11-06 MED ORDER — SODIUM CHLORIDE 0.9% FLUSH: 3.0000 mL | Freq: Once | INTRAVENOUS | Status: DC

## 2018-11-06 NOTE — ED Triage Notes (Signed)
Patient reports central chest pain onset this afternoon with SOB and emesis , denies diaphoresis , no cough or fever .

## 2018-11-07 ENCOUNTER — Emergency Department (HOSPITAL_COMMUNITY): Payer: Medicare HMO

## 2018-11-07 DIAGNOSIS — K760 Fatty (change of) liver, not elsewhere classified: Secondary | ICD-10-CM | POA: Diagnosis not present

## 2018-11-07 LAB — HEPATIC FUNCTION PANEL
ALT: 31 U/L (ref 0–44)
AST: 24 U/L (ref 15–41)
Albumin: 3.8 g/dL (ref 3.5–5.0)
Alkaline Phosphatase: 63 U/L (ref 38–126)
Bilirubin, Direct: 0.1 mg/dL (ref 0.0–0.2)
Indirect Bilirubin: 1 mg/dL — ABNORMAL HIGH (ref 0.3–0.9)
Total Bilirubin: 1.1 mg/dL (ref 0.3–1.2)
Total Protein: 7.2 g/dL (ref 6.5–8.1)

## 2018-11-07 LAB — SARS CORONAVIRUS 2 (TAT 6-24 HRS): SARS Coronavirus 2: NEGATIVE

## 2018-11-07 LAB — TROPONIN I (HIGH SENSITIVITY)
Troponin I (High Sensitivity): 10 ng/L (ref <18)
Troponin I (High Sensitivity): 15 ng/L (ref <18)
Troponin I (High Sensitivity): 15 ng/L (ref <18)

## 2018-11-07 LAB — LIPASE, BLOOD: Lipase: 23 U/L (ref 11–51)

## 2018-11-07 MED ORDER — ASPIRIN 81 MG PO CHEW
324.0000 mg | CHEWABLE_TABLET | Freq: Once | ORAL | Status: AC
  Administered 2018-11-07: 324 mg via ORAL
  Filled 2018-11-07: qty 4

## 2018-11-07 MED ORDER — ONDANSETRON 8 MG PO TBDP: 8.0000 mg | ORAL_TABLET | Freq: Three times a day (TID) | ORAL | 0 refills | Status: DC | PRN

## 2018-11-07 MED ORDER — ALUM & MAG HYDROXIDE-SIMETH 200-200-20 MG/5ML PO SUSP
30.0000 mL | Freq: Once | ORAL | Status: AC
  Administered 2018-11-07: 30 mL via ORAL
  Filled 2018-11-07: qty 30

## 2018-11-07 MED ORDER — METOPROLOL TARTRATE 25 MG PO TABS
50.0000 mg | ORAL_TABLET | Freq: Once | ORAL | Status: AC
  Administered 2018-11-07: 50 mg via ORAL
  Filled 2018-11-07: qty 2

## 2018-11-07 MED ORDER — OMEPRAZOLE 20 MG PO CPDR: 20.0000 mg | DELAYED_RELEASE_CAPSULE | Freq: Every day | ORAL | 0 refills | Status: DC

## 2018-11-07 NOTE — ED Notes (Signed)
Pt tolerated water well and does not want to eat until he gets home.

## 2018-11-07 NOTE — Discharge Instructions (Signed)
You may have had a component of gallbladder spasm today. Try to eat low-fat.  If you are having ongoing pain, nausea, and vomiting, you may need to return to the emergency department.  Your chest pain did not appear to be from your heart, but if it returns you should be reevaluated.  Please call Dr. Irven Shelling office Monday for follow-up regarding your heart.  Please call the gastroenterologist office for further evaluation of your gallbladder and abdominal pain.

## 2018-11-07 NOTE — ED Provider Notes (Addendum)
[facility] EM[facility]ENCY DEPARTMENT Provider Note   CSN: [id number] Arrival date & time: 11/06/18  2125     History   Chief Complaint Chief Complaint  Patient presents with  . Chest Pain    HPI Brian Clarke is a 73 y.o. male.     HPI  73 year old male history of hypertension, artery disease, hyperlipidemia, peripheral vascular disease presents today complaining of chest pain.  He states that yesterday he had a spicy chicken sandwich for lunch and afterwards began having pressure, burning, and heaviness in his anterior chest.  It was 7 out of 10.  He has had 2 prior episodes of this in the past year and a half.  He took Prilosec and baking soda.  He states initially felt somewhat better after this and then he became nauseated and vomited 4 times.  The pain worsened again back to 7 out of 10.  He continued to have it throughout the day.  He tried to go to bed at 9:00 but was uncomfortable and came to the hospital secondary to this.  He has not taken his evening medications or this morning's medications.  He had labs, chest x-Kota Ciancio, and EKG done in the waiting room.  His pain is currently subsided to a 3 out of 10 without any definite intervention.  He denies any history of DVTs but states he has had a blood clot in the front of his left leg which his doctors had noted.  He is not on any blood thinners.  He denies any history of PE.  He denies having any recent fever, cough, Rea, nausea and vomiting prior to this 1 episode.  He has no known sick exposures or known Covid exposures.  He is a former smoker.  He drinks 3 drinks of rum per day.  His cardiologist is Dr. Einar Gip who has been assessing him for his peripheral vascular disease, he does not think he has had any prior definitive cardiac studies, specifically relates no history of cardiac catheterization or stress testing.  Nuclear stress test  12/2012 Treadmill stress test  02/2013 Normal. Echocardiogram  02/2013 Normal.   Past Medical History:  Diagnosis Date  . Allergy   . Anxiety   . Arthritis    thumbs   . BPH (benign prostatic hypertrophy)   . Carotid artery disease ([place]) 2019 report   <50% on left, 50-69% on right  . Carotid disease, bilateral ([place])   . Hyperlipidemia   . Hypertension   . Personal history of colonic polyps 10/06/2006  . PVD (peripheral vascular disease) ([place])    Left SFA- 9/1/20150 angioplasty , right SFA- mild to moderate disease, Carotids- 50% stenosis right proximal ICA     Patient Active Problem List   Diagnosis Date Noted  . Carotid disease, bilateral ([place])   . Carotid artery disease ([place]) 01/03/2015  . Preventative health care 02/21/2014  . Advance directive discussed with patient 02/21/2014  . PAD (peripheral artery disease) ([place]) 09/07/2013  . PVD (peripheral vascular disease) ([place])   . ALLERGIC RHINITIS 06/16/2006  . Hyperlipemia 05/20/2006  . Essential hypertension, benign 05/20/2006  . BPH with obstruction/lower urinary tract symptoms 05/20/2006    Past Surgical History:  Procedure Laterality Date  . ANGIOPLASTY  09/07/2013    SFA wtih drug coated balloon   . [place]  [phone] or so   severe  . carotid doppler study   06/2014    blockage in carotid arteries followed by Dr Einar Gip   .  CERVICAL DISC SURGERY  ~1990  . COLONOSCOPY    . CYSTOSCOPY WITH RETROGRADE PYELOGRAM, URETEROSCOPY AND STENT PLACEMENT Left 07/01/2014   Procedure: CYSTOSCOPY WITH bilateral RETROGRADE PYELOGRAM,  left URETEROSCOPY, left STENT PLACEMENT ;  Surgeon: Alexis Frock, MD;  Location: [facility];  Service: Urology;  Laterality: Left;  . HOLMIUM LASER APPLICATION Left [id number]   Procedure: HOLMIUM LASER APPLICATION;  Surgeon: Alexis Frock, MD;  Location: [facility];  Service: Urology;  Laterality: Left;  . LOWER EXTREMITY ANGIOGRAM N/A 09/07/2013   Procedure: LOWER EXTREMITY ANGIOGRAM;  Surgeon: [name], MD;  Location: [facility] CATH LAB;  Service: Cardiovascular;  Laterality: N/A;  . PERIPHERAL  VASCULAR CATHETERIZATION N/A 08/29/2015   Procedure: Lower Extremity Angiography;  Surgeon: [name], MD;  Location: [facility] CV LAB;  Service: Cardiovascular;  Laterality: N/A;  . POLYPECTOMY          Home Medications    Prior to Admission medications   Medication Sig Start Date End Date Taking? Authorizing Provider  amLODipine (NORVASC) 5 MG tablet Take 1 tablet (5 mg total) by mouth daily at 2 PM. 10/16/18   [name], MD  aspirin EC 81 MG tablet Take 81 mg by mouth daily.    [provider]  Cholecalciferol (VITAMIN D3) 5000 units TABS Take 1 tablet by mouth daily.    [provider]  Cyanocobalamin (B-12) 5000 MCG SUBL Place under the tongue.    [provider]  doxazosin (CARDURA) 4 MG tablet TAKE 1 TABLET BY MOUTH DAILY 04/07/18   Viviana Simpler I, MD  metoprolol succinate (TOPROL-XL) 100 MG 24 hr tablet TAKE 1 TABLET BY MOUTH TWICE DAILY WITH OR IMMEDIATELY FOLLOWING A MEAL Patient taking differently: Take 100 mg by mouth 2 (two) times daily.  07/20/18   [name], MD  olmesartan-hydrochlorothiazide (BENICAR HCT) 20-12.5 MG tablet Take 1 tablet by mouth every morning. 10/16/18   [name], MD  pyridoxine (B-6) 500 MG tablet Take 1,000 mg by mouth daily.    [provider]  rosuvastatin (CRESTOR) 20 MG tablet Take 1 tablet (20 mg total) by mouth daily. 10/16/18   [name], MD  valACYclovir (VALTREX) 1000 MG tablet Take 1 tablet (1,000 mg total) by mouth 3 (three) times daily. 11/21/17   [name], MD    Family History Family History  Problem Relation Age of Onset  . Cancer Neg Hx   . Diabetes Neg Hx   . Heart disease Neg Hx   . Colon cancer Neg Hx   . Colon polyps Neg Hx   . Esophageal cancer Neg Hx   . Rectal cancer Neg Hx   . Stomach cancer Neg Hx     Social History Social History   Tobacco Use  . Smoking status: Former Smoker    Packs/day: 1.00    Years: 58.00    Pack years: 58.00    Types: Cigarettes     Quit date: 01/07/2017    Years since quitting: 1.8  . Smokeless tobacco: Never Used  Substance Use Topics  . Alcohol use: Yes    Alcohol/week: 21.0 standard drinks    Types: 21 Standard drinks or equivalent per week    Comment: 1/2 gallon per week of rum x 35 years -3 a day   . Drug use: No    Comment: hx of marijuana use years ago      Allergies   Micardis [telmisartan] and Tadalafil   Review of Systems Review of Systems  All other systems reviewed and are negative.    Physical Exam Updated Vital Signs BP (!) 178/78 (BP Location: Left Arm)   Pulse 93   Temp 99.5 F (37.5 C) (Oral)   Resp 16   SpO2 95%   Physical Exam Vitals signs and nursing note reviewed.  Constitutional:      General: He is not in acute distress.    Appearance: He is well-developed. He is obese. He is not ill-appearing.  HENT:     Head: Normocephalic.  Eyes:     Pupils: Pupils are equal, round, and reactive to light.  Neck:     Musculoskeletal: Normal range of motion.  Cardiovascular:     Rate and Rhythm: Normal rate and regular rhythm.     Pulses:          Posterior tibial pulses are 1+ on the right side and 1+ on the left side.     Heart sounds: Normal heart sounds.  Pulmonary:     Effort: Pulmonary effort is normal.     Breath sounds: Normal breath sounds.  Abdominal:     General: Bowel sounds are normal.     Palpations: Abdomen is soft.     Comments: Mild epigastric and right upper quadrant tenderness to palpation  Musculoskeletal: Normal range of motion.  Skin:    General: Skin is warm and dry.  Neurological:     General: No focal deficit present.     Mental Status: He is alert.      ED Treatments / Results  Labs (all labs ordered are listed, but only abnormal results are displayed) Labs Reviewed  BASIC METABOLIC PANEL - Abnormal; Notable for the following components:      Result Value   Glucose, Bld 150 (*)    Anion gap 16 (*)    All other components within normal limits   CBC - Abnormal; Notable for the following components:   WBC 11.0 (*)    RBC 4.07 (*)    MCV 100.5 (*)    MCH 34.9 (*)    All other components within normal limits  PROTIME-INR  TROPONIN I (HIGH SENSITIVITY)  TROPONIN I (HIGH SENSITIVITY)  TROPONIN I (HIGH SENSITIVITY)    EKG EKG Interpretation  Date/Time:  Friday November 06 2018 21:41:25 EDT Ventricular Rate:  95 PR Interval:  186 QRS Duration: 90 QT Interval:  372 QTC Calculation: 467 R Axis:   87 Text Interpretation: Normal sinus rhythm Cannot rule out Anterior infarct , age undetermined Abnormal ECG When compared with ECG of 08/29/2015, No significant change was found Confirmed by Delora Fuel ([id number]) on 11/07/2018 12:23:15 AM   Radiology Dg Chest 2 View  Result Date: 11/06/2018 CLINICAL DATA:  Chest pain EXAM: CHEST - 2 VIEW COMPARISON:  None FINDINGS: The heart size is mildly enlarged. Aortic calcifications are noted. There is no pneumothorax or large pleural effusion. There is a possible nodular density overlying the right lower lung zone measuring approximately 1.6 cm. There is no acute osseous abnormality. IMPRESSION: 1. No acute cardiopulmonary process. 2. Subtle nodular density projecting over the right lower lung zone as detailed above. A 4-6 week follow-up two-view chest x-Lakecia Deschamps is recommended to confirm stability of this finding. Electronically Signed   By: Constance Holster M.D.   On: 11/06/2018 22:24    Procedures Procedures (including critical care time)  Medications Ordered in ED Medications  sodium chloride flush (NS) 0.9 % injection 3 mL (has no administration in time range)  alum &  mag hydroxide-simeth (MAALOX/MYLANTA) 200-200-20 MG/5ML suspension 30 mL (has no administration in time range)     Initial Impression / Assessment and Plan / ED Course  I have reviewed the triage vital signs and the nursing notes.  Pertinent labs & imaging results that were available during my care of the patient were  reviewed by me and considered in my medical decision making (see chart for details).       Discussed with Dr. Einar Gip who feels that patient from a cardiac standpoint is stable and can be discharged home Reviewed ultrasound Discussed with general surgery Plan p.o. challenge Patient tolerated p.o. fluid challenge. Discussed return precautions and need for follow-up and will refer to GI. He is instructed that if he has any change or worsening chest pain he should return to the ED. Final Clinical Impressions(s) / ED Diagnoses   Final diagnoses:  Epigastric pain  Chest pain, unspecified type  Non-intractable vomiting with nausea, unspecified vomiting type    ED Discharge Orders    None       Pattricia Boss, MD 11/07/18 1555    Pattricia Boss, MD 11/07/18 1556

## 2018-11-07 NOTE — ED Notes (Signed)
Pt is NSR on monitor 

## 2018-11-07 NOTE — ED Notes (Signed)
Pt transported to Ultrasound.  

## 2018-11-12 ENCOUNTER — Other Ambulatory Visit: Payer: Self-pay

## 2018-11-12 ENCOUNTER — Ambulatory Visit (INDEPENDENT_AMBULATORY_CARE_PROVIDER_SITE_OTHER): Payer: Medicare HMO | Admitting: Cardiology

## 2018-11-12 ENCOUNTER — Encounter: Payer: Self-pay | Admitting: Cardiology

## 2018-11-12 VITALS — BP 110/72 | HR 79 | Ht 68.0 in | Wt 255.6 lb

## 2018-11-12 DIAGNOSIS — E7800 Pure hypercholesterolemia, unspecified: Secondary | ICD-10-CM | POA: Diagnosis not present

## 2018-11-12 DIAGNOSIS — R0789 Other chest pain: Secondary | ICD-10-CM | POA: Diagnosis not present

## 2018-11-12 DIAGNOSIS — I10 Essential (primary) hypertension: Secondary | ICD-10-CM | POA: Diagnosis not present
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
## 2019-03-01 ENCOUNTER — Encounter: Payer: Medicare HMO | Admitting: Internal Medicine

## 2019-03-23 ENCOUNTER — Other Ambulatory Visit: Payer: Self-pay

## 2019-03-23 ENCOUNTER — Ambulatory Visit: Payer: Medicare HMO

## 2019-03-23 DIAGNOSIS — I739 Peripheral vascular disease, unspecified: Secondary | ICD-10-CM

## 2019-03-26 ENCOUNTER — Other Ambulatory Visit: Payer: Self-pay

## 2019-03-26 ENCOUNTER — Encounter: Payer: Self-pay | Admitting: Internal Medicine

## 2019-03-26 ENCOUNTER — Ambulatory Visit (INDEPENDENT_AMBULATORY_CARE_PROVIDER_SITE_OTHER): Payer: Medicare HMO | Admitting: Internal Medicine

## 2019-03-26 VITALS — BP 118/60 | HR 75 | Temp 97.7°F | Ht 68.0 in | Wt 261.0 lb

## 2019-03-26 DIAGNOSIS — Z7189 Other specified counseling: Secondary | ICD-10-CM

## 2019-03-26 DIAGNOSIS — Z0000 Encounter for general adult medical examination without abnormal findings: Secondary | ICD-10-CM

## 2019-03-26 DIAGNOSIS — K219 Gastro-esophageal reflux disease without esophagitis: Secondary | ICD-10-CM | POA: Diagnosis not present

## 2019-03-26 DIAGNOSIS — I739 Peripheral vascular disease, unspecified: Secondary | ICD-10-CM

## 2019-03-26 DIAGNOSIS — N401 Enlarged prostate with lower urinary tract symptoms: Secondary | ICD-10-CM

## 2019-03-26 DIAGNOSIS — I6523 Occlusion and stenosis of bilateral carotid arteries: Secondary | ICD-10-CM

## 2019-03-26 DIAGNOSIS — N138 Other obstructive and reflux uropathy: Secondary | ICD-10-CM

## 2019-03-26 DIAGNOSIS — I10 Essential (primary) hypertension: Secondary | ICD-10-CM | POA: Diagnosis not present

## 2019-03-26 MED ORDER — OMEPRAZOLE 20 MG PO CPDR: 20.0000 mg | DELAYED_RELEASE_CAPSULE | Freq: Every day | ORAL | 3 refills | Status: DC

## 2019-03-26 NOTE — Assessment & Plan Note (Signed)
Probably cause of chest pain Will Rx the omeprazole

## 2019-03-26 NOTE — Assessment & Plan Note (Signed)
See social history 

## 2019-03-26 NOTE — Assessment & Plan Note (Signed)
Stable but severe claudication On ASA and statin

## 2019-03-26 NOTE — Progress Notes (Signed)
Hearing Screening   125Hz  250Hz  500Hz  1000Hz  2000Hz  3000Hz  4000Hz  6000Hz  8000Hz   Right ear:   20 20 20  20     Left ear:   20 20 20  20       Visual Acuity Screening   Right eye Left eye Both eyes  Without correction: 20/15 20/15 20/15   With correction:

## 2019-03-26 NOTE — Assessment & Plan Note (Signed)
Ongoing stable symptoms on doxazosin

## 2019-03-26 NOTE — Assessment & Plan Note (Signed)
I have personally reviewed the Medicare Annual Wellness questionnaire and have noted 1. The patient's medical and social history 2. Their use of alcohol, tobacco or illicit drugs 3. Their current medications and supplements 4. The patient's functional ability including ADL's, fall risks, home safety risks and hearing or visual             impairment. 5. Diet and physical activities 6. Evidence for depression or mood disorders  The patients weight, height, BMI and visual acuity have been recorded in the chart I have made referrals, counseling and provided education to the patient based review of the above and I have provided the pt with a written personalized care plan for preventive services.  I have provided you with a copy of your personalized plan for preventive services. Please take the time to review along with your updated medication list.  Done with colons--last 2017 No PSA due to age Prefers no flu vaccine ----or COVID (urged him to reconsider) Discussed lung cancer screening--he doesn't want it Discussed home exercise program

## 2019-03-26 NOTE — Assessment & Plan Note (Signed)
Monitored regularly On ASA and statin
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
## 2019-03-26 NOTE — Assessment & Plan Note (Signed)
BP Readings from Last 3 Encounters:  03/26/19 118/60  11/12/18 110/72  11/07/18 (!) 172/76   Good control

## 2019-03-27 ENCOUNTER — Other Ambulatory Visit: Payer: Self-pay | Admitting: Internal Medicine

## 2019-04-05 ENCOUNTER — Other Ambulatory Visit: Payer: Medicare HMO

## 2019-04-16 ENCOUNTER — Encounter: Payer: Self-pay | Admitting: Cardiology

## 2019-04-16 ENCOUNTER — Other Ambulatory Visit: Payer: Self-pay

## 2019-04-16 ENCOUNTER — Ambulatory Visit: Payer: Medicare HMO | Admitting: Cardiology

## 2019-04-16 VITALS — BP 127/67 | HR 74 | Temp 98.4°F | Resp 16 | Ht 68.0 in | Wt 262.0 lb

## 2019-04-16 DIAGNOSIS — I739 Peripheral vascular disease, unspecified: Secondary | ICD-10-CM | POA: Diagnosis not present

## 2019-04-16 DIAGNOSIS — I6523 Occlusion and stenosis of bilateral carotid arteries: Secondary | ICD-10-CM

## 2019-04-16 DIAGNOSIS — I10 Essential (primary) hypertension: Secondary | ICD-10-CM

## 2019-04-16 DIAGNOSIS — G621 Alcoholic polyneuropathy: Secondary | ICD-10-CM | POA: Diagnosis not present

## 2019-04-16 DIAGNOSIS — R600 Localized edema: Secondary | ICD-10-CM

## 2019-04-16 MED ORDER — FUROSEMIDE 20 MG PO TABS: 20.0000 mg | ORAL_TABLET | Freq: Every day | ORAL | 2 refills | Status: DC | PRN
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
## 2019-04-22 ENCOUNTER — Other Ambulatory Visit: Payer: Self-pay

## 2019-04-22 ENCOUNTER — Ambulatory Visit: Payer: Medicare HMO

## 2019-04-22 DIAGNOSIS — I6523 Occlusion and stenosis of bilateral carotid arteries: Secondary | ICD-10-CM

## 2019-04-24 ENCOUNTER — Other Ambulatory Visit: Payer: Self-pay | Admitting: Cardiology

## 2019-04-24 DIAGNOSIS — I6523 Occlusion and stenosis of bilateral carotid arteries: Secondary | ICD-10-CM

## 2019-06-11 ENCOUNTER — Telehealth: Payer: Self-pay

## 2019-06-11 NOTE — Telephone Encounter (Signed)
I have advised him to take lasix daily and if no response to increase to two tab daily for a week. Sent my chart message with check mark to let me know if he does not receive the message

## 2019-06-11 NOTE — Telephone Encounter (Signed)
Pt called to inform us that he is gaining weight. Pt has noticed that he has gain 5lb in week. Pt would like to see if you could change the fluid pill due that he has had leg swelling. Please advise.

## 2019-06-14 NOTE — Telephone Encounter (Signed)
Pt advised to take 2 tabs daily of Lasix for 1 week for weight gain per JG, pt agreed and will call us and let us know if this doesn't help his weight gain

## 2019-06-25 ENCOUNTER — Other Ambulatory Visit: Payer: Self-pay | Admitting: Internal Medicine

## 2019-06-25 ENCOUNTER — Other Ambulatory Visit: Payer: Self-pay | Admitting: Cardiology

## 2019-06-25 DIAGNOSIS — R600 Localized edema: Secondary | ICD-10-CM

## 2019-08-16 DIAGNOSIS — Z125 Encounter for screening for malignant neoplasm of prostate: Secondary | ICD-10-CM | POA: Diagnosis not present

## 2019-08-16 DIAGNOSIS — N200 Calculus of kidney: Secondary | ICD-10-CM | POA: Diagnosis not present

## 2019-08-16 DIAGNOSIS — R3912 Poor urinary stream: Secondary | ICD-10-CM | POA: Diagnosis not present

## 2019-08-16 DIAGNOSIS — N281 Cyst of kidney, acquired: Secondary | ICD-10-CM | POA: Diagnosis not present

## 2019-10-18 ENCOUNTER — Ambulatory Visit: Payer: Medicare HMO

## 2019-10-18 ENCOUNTER — Other Ambulatory Visit: Payer: Self-pay

## 2019-10-18 ENCOUNTER — Ambulatory Visit: Payer: Medicare HMO | Admitting: Cardiology

## 2019-10-18 DIAGNOSIS — I6523 Occlusion and stenosis of bilateral carotid arteries: Secondary | ICD-10-CM | POA: Diagnosis not present

## 2019-10-27 ENCOUNTER — Ambulatory Visit: Payer: Medicare HMO | Admitting: Cardiology

## 2019-10-27 ENCOUNTER — Other Ambulatory Visit: Payer: Self-pay

## 2019-10-27 ENCOUNTER — Encounter: Payer: Self-pay | Admitting: Cardiology

## 2019-10-27 VITALS — BP 133/71 | HR 55 | Resp 16 | Ht 68.0 in | Wt 267.4 lb

## 2019-10-27 DIAGNOSIS — R739 Hyperglycemia, unspecified: Secondary | ICD-10-CM

## 2019-10-27 DIAGNOSIS — R0609 Other forms of dyspnea: Secondary | ICD-10-CM | POA: Diagnosis not present

## 2019-10-27 DIAGNOSIS — I6523 Occlusion and stenosis of bilateral carotid arteries: Secondary | ICD-10-CM

## 2019-10-27 DIAGNOSIS — I10 Essential (primary) hypertension: Secondary | ICD-10-CM

## 2019-10-27 DIAGNOSIS — I739 Peripheral vascular disease, unspecified: Secondary | ICD-10-CM | POA: Diagnosis not present

## 2019-10-27 DIAGNOSIS — E7800 Pure hypercholesterolemia, unspecified: Secondary | ICD-10-CM | POA: Diagnosis not present
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
## 2019-11-15 DIAGNOSIS — Z6841 Body Mass Index (BMI) 40.0 and over, adult: Secondary | ICD-10-CM | POA: Diagnosis not present

## 2019-11-15 DIAGNOSIS — N200 Calculus of kidney: Secondary | ICD-10-CM | POA: Diagnosis not present

## 2019-11-15 DIAGNOSIS — Z125 Encounter for screening for malignant neoplasm of prostate: Secondary | ICD-10-CM | POA: Diagnosis not present

## 2019-11-15 DIAGNOSIS — I509 Heart failure, unspecified: Secondary | ICD-10-CM | POA: Diagnosis not present

## 2019-11-15 DIAGNOSIS — R3912 Poor urinary stream: Secondary | ICD-10-CM | POA: Diagnosis not present

## 2019-11-27 ENCOUNTER — Other Ambulatory Visit: Payer: Self-pay | Admitting: Cardiology

## 2019-11-27 DIAGNOSIS — R600 Localized edema: Secondary | ICD-10-CM

## 2019-11-27 DIAGNOSIS — I10 Essential (primary) hypertension: Secondary | ICD-10-CM

## 2019-11-27 DIAGNOSIS — E7800 Pure hypercholesterolemia, unspecified: Secondary | ICD-10-CM

## 2019-12-07 DIAGNOSIS — R3912 Poor urinary stream: Secondary | ICD-10-CM | POA: Diagnosis not present

## 2019-12-07 DIAGNOSIS — R3915 Urgency of urination: Secondary | ICD-10-CM | POA: Diagnosis not present

## 2019-12-07 DIAGNOSIS — R350 Frequency of micturition: Secondary | ICD-10-CM | POA: Diagnosis not present

## 2020-01-07 ENCOUNTER — Other Ambulatory Visit: Payer: Self-pay | Admitting: Cardiology

## 2020-01-07 DIAGNOSIS — R600 Localized edema: Secondary | ICD-10-CM

## 2020-01-10 DIAGNOSIS — R3912 Poor urinary stream: Secondary | ICD-10-CM | POA: Diagnosis not present

## 2020-01-10 DIAGNOSIS — R350 Frequency of micturition: Secondary | ICD-10-CM | POA: Diagnosis not present

## 2020-01-10 DIAGNOSIS — N200 Calculus of kidney: Secondary | ICD-10-CM | POA: Diagnosis not present

## 2020-01-10 DIAGNOSIS — N281 Cyst of kidney, acquired: Secondary | ICD-10-CM | POA: Diagnosis not present

## 2020-01-13 ENCOUNTER — Other Ambulatory Visit: Payer: Self-pay | Admitting: Urology

## 2020-01-25 ENCOUNTER — Encounter: Payer: Self-pay | Admitting: Cardiology

## 2020-02-09 ENCOUNTER — Other Ambulatory Visit (HOSPITAL_COMMUNITY)
Admission: RE | Admit: 2020-02-09 | Discharge: 2020-02-09 | Disposition: A | Discharge status: Home / Self Care | Payer: Medicare HMO | Source: Ambulatory Visit | Attending: Urology | Admitting: Urology

## 2020-02-09 ENCOUNTER — Other Ambulatory Visit: Payer: Self-pay

## 2020-02-09 ENCOUNTER — Encounter (HOSPITAL_BASED_OUTPATIENT_CLINIC_OR_DEPARTMENT_OTHER): Payer: Self-pay | Admitting: Urology

## 2020-02-09 DIAGNOSIS — Z01812 Encounter for preprocedural laboratory examination: Secondary | ICD-10-CM | POA: Insufficient documentation

## 2020-02-09 DIAGNOSIS — Z20822 Contact with and (suspected) exposure to covid-19: Secondary | ICD-10-CM | POA: Insufficient documentation

## 2020-02-09 NOTE — Progress Notes (Signed)
Spoke w/ via phone for pre-op interview--- Pt and pt's wife Lab needs dos---- [employer]              Lab results------ current ekg in epic/ chart COVID test ------ done 02-09-2020 result in epic Arrive at ------- 0830 on 02-11-2020 NPO after MN NO Solid Food.  Clear liquids from MN until--- 0730 Medications to take morning of surgery ----- Crestor, Toprol, Norvasc, Proscar, Flomax, Zyrtec Diabetic medication ----- n/a Patient Special Instructions ----- reviewed RCC and visitor guidelines  Pre-Op special Istructions ----- pt has cardiac clearance by dr Einar Gip dated 01-25-2020 w/ chart received from dr Tresa Moore office and when to stop asa.   Patient verbalized understanding of instructions that were given at this phone interview. Patient denies shortness of breath, chest pain, fever, cough at this phone interview.   Anesthesia : Claudication in PVD s/p angioplasty left SFA 2015 and mild right SFA disease;  HTN:  Bilateral carotid stenosis per dt ganji pt is asymptomatic (no hx stroke);  Pt denies cardiac s&s, but does have sob with exertion and peripheral edema.   PCP:  Dr Wilhemena Durie  (lov 03-26-2019 epic) Cardiologist :  Dr Einar Gip (lov 11-27-2019 epic Chest x-ray : 11-06-2018 epic EKG :  10-27-2019 epic Echo : 10-09-2006 epic Stress test: no Cardiac Cath :  no Activity level:  Sob w/ exertion Sleep Study/ CPAP :   NO Fasting Blood Sugar :      / Checks Blood Sugar -- times a day:  N/A Blood Thinner/ Instructions /Last Dose:  NO ASA / Instructions/ Last Dose :  ASA 81mg  Pt stated given instructions by dr Tresa Moore office to stop asa 7 days prior to surgery,  Last dose 01-31-2020

## 2020-02-10 LAB — SARS CORONAVIRUS 2 (TAT 6-24 HRS): SARS Coronavirus 2: NEGATIVE

## 2020-02-11 ENCOUNTER — Encounter (HOSPITAL_BASED_OUTPATIENT_CLINIC_OR_DEPARTMENT_OTHER)
Admission: RE | Disposition: A | Discharge status: Home / Self Care | Payer: Self-pay | Source: Home / Self Care | Attending: Urology

## 2020-02-11 ENCOUNTER — Ambulatory Visit (HOSPITAL_BASED_OUTPATIENT_CLINIC_OR_DEPARTMENT_OTHER): Payer: Medicare HMO | Admitting: Physician Assistant

## 2020-02-11 ENCOUNTER — Other Ambulatory Visit: Payer: Self-pay

## 2020-02-11 ENCOUNTER — Encounter (HOSPITAL_BASED_OUTPATIENT_CLINIC_OR_DEPARTMENT_OTHER): Payer: Self-pay | Admitting: Urology

## 2020-02-11 ENCOUNTER — Ambulatory Visit (HOSPITAL_BASED_OUTPATIENT_CLINIC_OR_DEPARTMENT_OTHER)
Admission: RE | Admit: 2020-02-11 | Discharge: 2020-02-12 | Disposition: A | Discharge status: Home / Self Care | Payer: Medicare HMO | Attending: Urology | Admitting: Urology

## 2020-02-11 DIAGNOSIS — C61 Malignant neoplasm of prostate: Secondary | ICD-10-CM | POA: Insufficient documentation

## 2020-02-11 DIAGNOSIS — N138 Other obstructive and reflux uropathy: Secondary | ICD-10-CM | POA: Diagnosis not present

## 2020-02-11 DIAGNOSIS — Z87891 Personal history of nicotine dependence: Secondary | ICD-10-CM | POA: Insufficient documentation

## 2020-02-11 DIAGNOSIS — N400 Enlarged prostate without lower urinary tract symptoms: Secondary | ICD-10-CM | POA: Diagnosis present

## 2020-02-11 DIAGNOSIS — N401 Enlarged prostate with lower urinary tract symptoms: Secondary | ICD-10-CM | POA: Insufficient documentation

## 2020-02-11 DIAGNOSIS — I509 Heart failure, unspecified: Secondary | ICD-10-CM | POA: Diagnosis not present

## 2020-02-11 DIAGNOSIS — E785 Hyperlipidemia, unspecified: Secondary | ICD-10-CM | POA: Diagnosis not present

## 2020-02-11 DIAGNOSIS — I10 Essential (primary) hypertension: Secondary | ICD-10-CM | POA: Diagnosis not present

## 2020-02-11 HISTORY — DX: Localized edema: R60.0

## 2020-02-11 HISTORY — PX: TRANSURETHRAL RESECTION OF PROSTATE: SHX73

## 2020-02-11 HISTORY — DX: Calculus of kidney: N20.0

## 2020-02-11 HISTORY — DX: Benign prostatic hyperplasia with lower urinary tract symptoms: N40.1

## 2020-02-11 HISTORY — DX: Benign prostatic hyperplasia with lower urinary tract symptoms: N13.8

## 2020-02-11 HISTORY — DX: Dyspnea, unspecified: R06.00

## 2020-02-11 HISTORY — DX: Peripheral vascular disease, unspecified: I73.9

## 2020-02-11 HISTORY — DX: Other forms of dyspnea: R06.09

## 2020-02-11 HISTORY — DX: Cyst of kidney, acquired: N28.1

## 2020-02-11 HISTORY — DX: Polyneuropathy, unspecified: G62.9

## 2020-02-11 HISTORY — DX: Occlusion and stenosis of bilateral carotid arteries: I65.23

## 2020-02-11 HISTORY — DX: Personal history of urinary calculi: Z87.442

## 2020-02-11 LAB — POCT I-STAT, CHEM 8
BUN: 19 mg/dL (ref 8–23)
Calcium, Ion: 1.46 mmol/L — ABNORMAL HIGH (ref 1.15–1.40)
Chloride: 107 mmol/L (ref 98–111)
Creatinine, Ser: 0.9 mg/dL (ref 0.61–1.24)
Glucose, Bld: 112 mg/dL — ABNORMAL HIGH (ref 70–99)
HCT: 37 % — ABNORMAL LOW (ref 39.0–52.0)
Hemoglobin: 12.6 g/dL — ABNORMAL LOW (ref 13.0–17.0)
Potassium: 4.1 mmol/L (ref 3.5–5.1)
Sodium: 140 mmol/L (ref 135–145)
TCO2: 22 mmol/L (ref 22–32)

## 2020-02-11 SURGERY — TURP (TRANSURETHRAL RESECTION OF PROSTATE)
Anesthesia: General | Site: Prostate

## 2020-02-11 MED ORDER — FENTANYL CITRATE (PF) 100 MCG/2ML IJ SOLN
25.0000 ug | INTRAMUSCULAR | Status: DC | PRN
Start: 2020-02-11 — End: 2020-02-12

## 2020-02-11 MED ORDER — GENTAMICIN SULFATE 40 MG/ML IJ SOLN
5.0000 mg/kg | INTRAVENOUS | Status: AC
  Administered 2020-02-11: 440 mg via INTRAVENOUS
  Filled 2020-02-11: qty 11

## 2020-02-11 MED ORDER — LORATADINE 10 MG PO TABS: 10.0000 mg | ORAL_TABLET | Freq: Every day | ORAL | Status: DC

## 2020-02-11 MED ORDER — LACTATED RINGERS IV SOLN: INTRAVENOUS | Status: DC

## 2020-02-11 MED ORDER — OXYCODONE HCL 5 MG PO TABS
ORAL_TABLET | ORAL | Status: AC
  Filled 2020-02-11: qty 1

## 2020-02-11 MED ORDER — BACITRACIN-NEOMYCIN-POLYMYXIN 400-5-5000 EX OINT
1.0000 "application " | TOPICAL_OINTMENT | Freq: Three times a day (TID) | CUTANEOUS | Status: DC | PRN
  Administered 2020-02-11 (×2): 1 via TOPICAL

## 2020-02-11 MED ORDER — SULFAMETHOXAZOLE-TRIMETHOPRIM 800-160 MG PO TABS: 1.0000 | ORAL_TABLET | Freq: Two times a day (BID) | ORAL | 0 refills | Status: DC

## 2020-02-11 MED ORDER — LIDOCAINE HCL (PF) 2 % IJ SOLN
INTRAMUSCULAR | Status: AC
  Filled 2020-02-11: qty 5

## 2020-02-11 MED ORDER — LIDOCAINE HCL (CARDIAC) PF 100 MG/5ML IV SOSY
PREFILLED_SYRINGE | INTRAVENOUS | Status: DC | PRN
  Administered 2020-02-11: 80 mg via INTRAVENOUS

## 2020-02-11 MED ORDER — PHENYLEPHRINE 40 MCG/ML (10ML) SYRINGE FOR IV PUSH (FOR BLOOD PRESSURE SUPPORT)
PREFILLED_SYRINGE | INTRAVENOUS | Status: AC
  Filled 2020-02-11: qty 10

## 2020-02-11 MED ORDER — ONDANSETRON HCL 4 MG/2ML IJ SOLN: 4.0000 mg | Freq: Once | INTRAMUSCULAR | Status: DC | PRN

## 2020-02-11 MED ORDER — ROSUVASTATIN CALCIUM 20 MG PO TABS
20.0000 mg | ORAL_TABLET | Freq: Every day | ORAL | Status: DC
  Filled 2020-02-11: qty 1

## 2020-02-11 MED ORDER — PROPOFOL 10 MG/ML IV BOLUS
INTRAVENOUS | Status: DC | PRN
  Administered 2020-02-11: 120 mg via INTRAVENOUS
  Administered 2020-02-11: 30 mg via INTRAVENOUS

## 2020-02-11 MED ORDER — ACETAMINOPHEN 500 MG PO TABS
ORAL_TABLET | ORAL | Status: AC
  Filled 2020-02-11: qty 2

## 2020-02-11 MED ORDER — SODIUM CHLORIDE 0.9 % IR SOLN
Status: DC | PRN
  Administered 2020-02-11: 6000 mL via INTRAVESICAL

## 2020-02-11 MED ORDER — OXYCODONE HCL 5 MG PO TABS: 5.0000 mg | ORAL_TABLET | Freq: Once | ORAL | Status: DC | PRN

## 2020-02-11 MED ORDER — OXYCODONE HCL 5 MG/5ML PO SOLN: 5.0000 mg | Freq: Once | ORAL | Status: DC | PRN

## 2020-02-11 MED ORDER — ONDANSETRON HCL 4 MG/2ML IJ SOLN
INTRAMUSCULAR | Status: AC
  Filled 2020-02-11: qty 2

## 2020-02-11 MED ORDER — ONDANSETRON HCL 4 MG/2ML IJ SOLN
INTRAMUSCULAR | Status: DC | PRN
  Administered 2020-02-11: 4 mg via INTRAVENOUS

## 2020-02-11 MED ORDER — OXYCODONE HCL 5 MG PO TABS
5.0000 mg | ORAL_TABLET | ORAL | Status: DC | PRN
Start: 2020-02-11 — End: 2020-02-12
  Administered 2020-02-11 (×3): 5 mg via ORAL

## 2020-02-11 MED ORDER — FENTANYL CITRATE (PF) 100 MCG/2ML IJ SOLN
INTRAMUSCULAR | Status: AC
  Filled 2020-02-11: qty 2

## 2020-02-11 MED ORDER — SODIUM CHLORIDE (PF) 0.9 % IJ SOLN
INTRAMUSCULAR | Status: AC
  Filled 2020-02-11: qty 10

## 2020-02-11 MED ORDER — SENNA 8.6 MG PO TABS
ORAL_TABLET | ORAL | Status: AC
  Filled 2020-02-11: qty 1

## 2020-02-11 MED ORDER — METOPROLOL SUCCINATE ER 100 MG PO TB24
100.0000 mg | ORAL_TABLET | Freq: Two times a day (BID) | ORAL | Status: DC
  Filled 2020-02-11: qty 1

## 2020-02-11 MED ORDER — FENTANYL CITRATE (PF) 100 MCG/2ML IJ SOLN
INTRAMUSCULAR | Status: DC | PRN
  Administered 2020-02-11: 50 ug via INTRAVENOUS
  Administered 2020-02-11 (×2): 25 ug via INTRAVENOUS

## 2020-02-11 MED ORDER — DEXAMETHASONE SODIUM PHOSPHATE 4 MG/ML IJ SOLN
INTRAMUSCULAR | Status: DC | PRN
  Administered 2020-02-11: 8 mg via INTRAVENOUS

## 2020-02-11 MED ORDER — TRAMADOL HCL 50 MG PO TABS: 50.0000 mg | ORAL_TABLET | Freq: Four times a day (QID) | ORAL | 0 refills | Status: DC | PRN

## 2020-02-11 MED ORDER — PROPOFOL 10 MG/ML IV BOLUS
INTRAVENOUS | Status: AC
  Filled 2020-02-11: qty 20

## 2020-02-11 MED ORDER — SENNOSIDES-DOCUSATE SODIUM 8.6-50 MG PO TABS
1.0000 | ORAL_TABLET | Freq: Two times a day (BID) | ORAL | Status: DC
  Administered 2020-02-11: 1 via ORAL
  Filled 2020-02-11: qty 1

## 2020-02-11 MED ORDER — AMLODIPINE BESYLATE 5 MG PO TABS
5.0000 mg | ORAL_TABLET | Freq: Every day | ORAL | Status: DC
  Filled 2020-02-11: qty 1

## 2020-02-11 MED ORDER — DEXAMETHASONE SODIUM PHOSPHATE 10 MG/ML IJ SOLN
INTRAMUSCULAR | Status: AC
  Filled 2020-02-11: qty 1

## 2020-02-11 MED ORDER — GLYCOPYRROLATE 0.2 MG/ML IJ SOLN
INTRAMUSCULAR | Status: DC | PRN
  Administered 2020-02-11 (×2): .1 mg via INTRAVENOUS

## 2020-02-11 MED ORDER — SODIUM CHLORIDE 0.9 % IR SOLN: 3000.0000 mL | Status: DC

## 2020-02-11 MED ORDER — AMISULPRIDE (ANTIEMETIC) 5 MG/2ML IV SOLN: 10.0000 mg | Freq: Once | INTRAVENOUS | Status: DC | PRN

## 2020-02-11 MED ORDER — TAMSULOSIN HCL 0.4 MG PO CAPS: 0.4000 mg | ORAL_CAPSULE | Freq: Every day | ORAL | Status: DC

## 2020-02-11 MED ORDER — PHENYLEPHRINE HCL (PRESSORS) 10 MG/ML IV SOLN
INTRAVENOUS | Status: DC | PRN
  Administered 2020-02-11: 80 ug via INTRAVENOUS
  Administered 2020-02-11 (×2): 120 ug via INTRAVENOUS
  Administered 2020-02-11 (×3): 80 ug via INTRAVENOUS
  Administered 2020-02-11 (×2): 120 ug via INTRAVENOUS

## 2020-02-11 MED ORDER — EPHEDRINE SULFATE 50 MG/ML IJ SOLN
INTRAMUSCULAR | Status: DC | PRN
  Administered 2020-02-11 (×4): 5 mg via INTRAVENOUS
  Administered 2020-02-11: 10 mg via INTRAVENOUS

## 2020-02-11 MED ORDER — EPHEDRINE 5 MG/ML INJ
INTRAVENOUS | Status: AC
  Filled 2020-02-11: qty 10

## 2020-02-11 MED ORDER — ACETAMINOPHEN 325 MG PO TABS
650.0000 mg | ORAL_TABLET | ORAL | Status: DC | PRN
Start: 2020-02-11 — End: 2020-02-12

## 2020-02-11 MED ORDER — PROPOFOL 10 MG/ML IV BOLUS
INTRAVENOUS | Status: AC
  Filled 2020-02-11: qty 40

## 2020-02-11 MED ORDER — BACITRACIN-NEOMYCIN-POLYMYXIN OINTMENT TUBE
TOPICAL_OINTMENT | CUTANEOUS | Status: AC
  Filled 2020-02-11: qty 14.17

## 2020-02-11 MED ORDER — SODIUM CHLORIDE 0.9 % IV SOLN: INTRAVENOUS | Status: DC

## 2020-02-11 MED ORDER — ACETAMINOPHEN 500 MG PO TABS
1000.0000 mg | ORAL_TABLET | Freq: Four times a day (QID) | ORAL | Status: AC
  Administered 2020-02-11 – 2020-02-12 (×4): 1000 mg via ORAL

## 2020-02-11 MED ORDER — MENTHOL 3 MG MT LOZG: 1.0000 | LOZENGE | OROMUCOSAL | Status: DC | PRN

## 2020-02-11 MED ORDER — HYDROMORPHONE HCL 1 MG/ML IJ SOLN
INTRAMUSCULAR | Status: AC
  Filled 2020-02-11: qty 1

## 2020-02-11 MED ORDER — FINASTERIDE 5 MG PO TABS
5.0000 mg | ORAL_TABLET | Freq: Every day | ORAL | Status: DC
Start: 2020-02-12 — End: 2020-02-12
  Filled 2020-02-11: qty 1

## 2020-02-11 MED ORDER — HYDROMORPHONE HCL 1 MG/ML IJ SOLN
0.5000 mg | INTRAMUSCULAR | Status: DC | PRN
  Administered 2020-02-11 (×2): 0.5 mg via INTRAVENOUS

## 2020-02-11 MED ORDER — PHENOL 1.4 % MT LIQD: 1.0000 | OROMUCOSAL | Status: DC | PRN

## 2020-02-11 SURGICAL SUPPLY — 32 items
BAG DRAIN URO-CYSTO SKYTR STRL (DRAIN) ×2 IMPLANT
BAG DRN RND TRDRP ANRFLXCHMBR (UROLOGICAL SUPPLIES) ×1
BAG DRN UROCATH (DRAIN) ×1
BAG URINE DRAIN 2000ML AR STRL (UROLOGICAL SUPPLIES) ×2 IMPLANT
BAG URINE LEG 500ML (DRAIN) IMPLANT
CATH FOLEY 2WAY SLVR 30CC 20FR (CATHETERS) IMPLANT
CATH FOLEY 3WAY 30CC 22FR (CATHETERS) ×1 IMPLANT
CLOTH BEACON ORANGE TIMEOUT ST (SAFETY) ×2 IMPLANT
ELECT BIVAP BIPO 22/24 DONUT (ELECTROSURGICAL)
ELECT REM PT RETURN 9FT ADLT (ELECTROSURGICAL) ×2
ELECTRD BIVAP BIPO 22/24 DONUT (ELECTROSURGICAL) IMPLANT
ELECTRODE REM PT RTRN 9FT ADLT (ELECTROSURGICAL) IMPLANT
GAUZE SPONGE 4X4 12PLY STRL LF (GAUZE/BANDAGES/DRESSINGS) ×1 IMPLANT
GLOVE BIO SURGEON STRL SZ7.5 (GLOVE) ×2 IMPLANT
GLOVE BIOGEL PI ORTHO PRO 7.5 (GLOVE) ×1
GLOVE PI ORTHO PRO STRL 7.5 (GLOVE) IMPLANT
GLOVE SURG SS PI 7.5 STRL IVOR (GLOVE) ×1 IMPLANT
GOWN STRL REUS W/TWL LRG LVL3 (GOWN DISPOSABLE) ×3 IMPLANT
GUIDEWIRE STR DUAL SENSOR (WIRE) IMPLANT
HOLDER FOLEY CATH W/STRAP (MISCELLANEOUS) ×2 IMPLANT
IV CATH 14GX2 1/4 (CATHETERS) ×1 IMPLANT
IV NS IRRIG 3000ML ARTHROMATIC (IV SOLUTION) ×6 IMPLANT
KIT TURNOVER CYSTO (KITS) ×2 IMPLANT
LOOP CUT BIPOLAR 24F LRG (ELECTROSURGICAL) ×1 IMPLANT
MANIFOLD NEPTUNE II (INSTRUMENTS) ×2 IMPLANT
NS IRRIG 1000ML POUR BTL (IV SOLUTION) ×1 IMPLANT
PACK CYSTO (CUSTOM PROCEDURE TRAY) ×2 IMPLANT
SYR TOOMEY IRRIG 70ML (MISCELLANEOUS) ×2
SYRINGE TOOMEY IRRIG 70ML (MISCELLANEOUS) ×1 IMPLANT
TUBE CONNECTING 12X1/4 (SUCTIONS) ×2 IMPLANT
TUBING UROLOGY SET (TUBING) ×2 IMPLANT
WATER STERILE IRR 500ML POUR (IV SOLUTION) ×1 IMPLANT
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
## 2020-02-11 NOTE — Anesthesia Postprocedure Evaluation (Signed)
Anesthesia Post Note  Patient: Brian Clarke [facility]  Procedure(s) Performed: TRANSURETHRAL RESECTION OF THE PROSTATE (TURP) (N/A Prostate)     Patient location during evaluation: PACU Anesthesia Type: General Level of consciousness: awake Pain management: pain level controlled Vital Signs Assessment: post-procedure vital signs reviewed and stable Respiratory status: spontaneous breathing and respiratory function stable Cardiovascular status: stable Postop Assessment: no apparent nausea or vomiting Anesthetic complications: no   No complications documented.  Last Vitals:  Vitals:   02/11/20 1215 02/11/20 1227  BP: 110/63 107/71  Pulse: 71 73  Resp: 16 17  Temp: 36.7 C 36.4 C  SpO2: 96% 100%    Last Pain:  Vitals:   02/11/20 1227  TempSrc:   PainSc: 4                  [name]

## 2020-02-11 NOTE — Brief Op Note (Signed)
02/11/2020  11:16 AM  PATIENT:  Arvella Merles Ion  75 y.o. male  PRE-OPERATIVE DIAGNOSIS:  REFRACTORY PROSTATIC HYPERTROPHY  POST-OPERATIVE DIAGNOSIS:  REFRACTORY PROSTATIC HYPERTROPHY  PROCEDURE:  Procedure(s) with comments: TRANSURETHRAL RESECTION OF THE PROSTATE (TURP) (N/A) - 1 HR  SURGEON:  Surgeon(s) and Role:    Alexis Frock, MD - Primary  PHYSICIAN ASSISTANT:   ASSISTANTS: none   ANESTHESIA:   general  EBL:  164mL   BLOOD ADMINISTERED:none  DRAINS: 34F 3 way foley to NS irrigation   LOCAL MEDICATIONS USED:  NONE  SPECIMEN:  Source of Specimen:  prostate chips  DISPOSITION OF SPECIMEN:  PATHOLOGY  COUNTS:  YES  TOURNIQUET:  * No tourniquets in log *  DICTATION: .Other Dictation: Dictation Number [id number]  PLAN OF CARE: Admit for overnight observation  PATIENT DISPOSITION:  PACU - hemodynamically stable.   Delay start of Pharmacological VTE agent (>24hrs) due to surgical blood loss or risk of bleeding: yes

## 2020-02-11 NOTE — Anesthesia Preprocedure Evaluation (Signed)
Anesthesia Evaluation  Patient identified by MRN, date of birth, ID band Patient awake    Reviewed: Allergy & Precautions, NPO status , Patient's Chart, lab work & pertinent test results  Airway Mallampati: II  TM Distance: >3 FB Neck ROM: Full    Dental no notable dental hx. (+) Poor Dentition   Pulmonary Current Smoker and Patient abstained from smoking., former smoker,    Pulmonary exam normal breath sounds clear to auscultation       Cardiovascular hypertension, Pt. on medications + Peripheral Vascular Disease (claudication; carotid stenosis)  DVT: kidney stones.  Normal cardiovascular exam Rhythm:Regular Rate:Normal     Neuro/Psych Anxiety  Neuromuscular disease (peripheral neuropathy)    GI/Hepatic Neg liver ROS, GERD  ,  Endo/Other  negative endocrine ROS  Renal/GU Renal disease  negative genitourinary   Musculoskeletal  (+) Arthritis ,   Abdominal   Peds negative pediatric ROS (+)  Hematology negative hematology ROS (+)   Anesthesia Other Findings   Reproductive/Obstetrics negative OB ROS                             Anesthesia Physical  Anesthesia Plan  ASA: III  Anesthesia Plan: General   Post-op Pain Management:    Induction: Intravenous  PONV Risk Score and Plan: 2 and Treatment may vary due to age or medical condition, Ondansetron and Dexamethasone  Airway Management Planned: LMA  Additional Equipment:   Intra-op Plan:   Post-operative Plan: Extubation in OR  Informed Consent: I have reviewed the patients History and Physical, chart, labs and discussed the procedure including the risks, benefits and alternatives for the proposed anesthesia with the patient or authorized representative who has indicated his/her understanding and acceptance.     Dental advisory given  Plan Discussed with: CRNA and Anesthesiologist  Anesthesia Plan Comments:          Anesthesia Quick Evaluation

## 2020-02-11 NOTE — Transfer of Care (Signed)
Immediate Anesthesia Transfer of Care Note  Patient: Brian Clarke [facility]  Procedure(s) Performed: TRANSURETHRAL RESECTION OF THE PROSTATE (TURP) (N/A Prostate)  Patient Location: PACU  Anesthesia Type:General  Level of Consciousness: awake, alert , oriented and patient cooperative  Airway & Oxygen Therapy: Patient Spontanous Breathing and Patient connected to nasal cannula oxygen  Post-op Assessment: Report given to RN and Post -op Vital signs reviewed and stable  Post vital signs: Reviewed and stable  Last Vitals:  Vitals Value Taken Time  BP 113/56 02/11/20 1132  Temp    Pulse 78 02/11/20 1135  Resp 18 02/11/20 1135  SpO2 98 % 02/11/20 1135  Vitals shown include unvalidated device data.  Last Pain:  Vitals:   02/11/20 0832  TempSrc: Oral         Complications: No complications documented.

## 2020-02-11 NOTE — Anesthesia Procedure Notes (Signed)
Procedure Name: LMA Insertion Date/Time: 02/11/2020 10:35 AM Performed by: Georgeanne Nim, CRNA Pre-anesthesia Checklist: Patient identified, Emergency Drugs available, Suction available, Patient being monitored and Timeout performed Patient Re-evaluated:Patient Re-evaluated prior to induction Oxygen Delivery Method: Circle system utilized Preoxygenation: Pre-oxygenation with 100% oxygen Induction Type: IV induction Ventilation: Mask ventilation without difficulty and Oral airway inserted - appropriate to patient size LMA: LMA inserted LMA Size: 4.0 Number of attempts: 1 Tube secured with: Tape Dental Injury: Teeth and Oropharynx as per pre-operative assessment

## 2020-02-12 DIAGNOSIS — I509 Heart failure, unspecified: Secondary | ICD-10-CM | POA: Diagnosis not present

## 2020-02-12 DIAGNOSIS — N401 Enlarged prostate with lower urinary tract symptoms: Secondary | ICD-10-CM | POA: Diagnosis not present

## 2020-02-12 DIAGNOSIS — Z87891 Personal history of nicotine dependence: Secondary | ICD-10-CM | POA: Diagnosis not present

## 2020-02-12 DIAGNOSIS — N138 Other obstructive and reflux uropathy: Secondary | ICD-10-CM | POA: Diagnosis not present

## 2020-02-12 DIAGNOSIS — C61 Malignant neoplasm of prostate: Secondary | ICD-10-CM | POA: Diagnosis not present

## 2020-02-12 LAB — BASIC METABOLIC PANEL
Anion gap: 8 (ref 5–15)
BUN: 23 mg/dL (ref 8–23)
CO2: 23 mmol/L (ref 22–32)
Calcium: 9.7 mg/dL (ref 8.9–10.3)
Chloride: 107 mmol/L (ref 98–111)
Creatinine, Ser: 0.94 mg/dL (ref 0.61–1.24)
GFR, Estimated: >60 mL/min (ref >60)
Glucose, Bld: 162 mg/dL — ABNORMAL HIGH (ref 70–99)
Potassium: 4.7 mmol/L (ref 3.5–5.1)
Sodium: 138 mmol/L (ref 135–145)

## 2020-02-12 LAB — HEMOGLOBIN AND HEMATOCRIT, BLOOD
HCT: 33.2 % — ABNORMAL LOW (ref 39.0–52.0)
Hemoglobin: 10.9 g/dL — ABNORMAL LOW (ref 13.0–17.0)

## 2020-02-12 MED ORDER — ACETAMINOPHEN 500 MG PO TABS
ORAL_TABLET | ORAL | Status: AC
  Filled 2020-02-12: qty 2

## 2020-02-12 MED ORDER — OXYCODONE HCL 5 MG PO TABS
ORAL_TABLET | ORAL | Status: AC
  Filled 2020-02-12: qty 1

## 2020-02-12 MED ORDER — DOCUSATE SODIUM 100 MG PO CAPS: 100.0000 mg | ORAL_CAPSULE | Freq: Two times a day (BID) | ORAL | 11 refills | Status: AC

## 2020-02-12 NOTE — Discharge Instructions (Signed)
Transurethral Resection of the Prostate, Care After This sheet gives you information about how to care for yourself after your procedure. Your health care provider may also give you more specific instructions. If you have problems or questions, contact your health care provider. What can I expect after the procedure? After the procedure, it is common to have: Mild pain in your lower abdomen. Soreness or mild discomfort in your penis from having the catheter inserted during the procedure. A feeling of urgency when you need to urinate. A small amount of blood in your urine. You may notice some small blood clots in your urine. These are normal. Follow these instructions at home: Medicines Take over-the-counter and prescription medicines only as told by your health care provider. If you were prescribed an antibiotic medicine, take it as told by your health care provider. Do not stop taking the antibiotic even if you start to feel better. Ask your health care provider if the medicine prescribed to you: Requires you to avoid driving or using heavy machinery. Can cause constipation. You may need to take actions to prevent or treat constipation, such as: Take over-the-counter or prescription medicines. Eat foods that are high in fiber, such as fresh fruits and vegetables, whole grains, and beans. Limit foods that are high in fat and processed sugars, such as fried or sweet foods. Do not drive for 24 hours if you were given a sedative during your procedure. Activity Return to your normal activities as told by your health care provider. Ask your health care provider what activities are safe for you. Do not lift anything that is heavier than 10 lb (4.5 kg), or the limit that you are told, for 3 weeks after the procedure or until your health care provider says that it is safe. Avoid intense physical activity for as long as told by your health care provider. Avoid sitting for a long time without  moving. Get up and move around one or more times every few hours. This helps to prevent blood clots. You may increase your physical activity gradually as you start to feel better.   Lifestyle Do not drink alcohol for as long as told by your health care provider. This is especially important if you are taking prescription pain medicines. Do not engage in sexual activity until your health care provider says that you can do this. General instructions Do not take baths, swim, or use a hot tub until your health care provider approves. Drink enough fluid to keep your urine pale yellow. Urinate as soon as you feel the need to. Do not try to hold your urine for long periods of time. If your health care provider approves, you may take a stool softener for 2-3 weeks to prevent you from straining to have a bowel movement. Wear compression stockings as told by your health care provider. These stockings help to prevent blood clots and reduce swelling in your legs. Keep all follow-up visits as told by your health care provider. This is important.   Contact a health care provider if you have: Difficulty urinating. A fever. Pain that gets worse or does not improve with medicine. Blood in your urine that does not go away after 1 week of resting and drinking more fluids. Swelling in your penis or testicles. Get help right away if: You are unable to urinate. You are having more blood clots in your urine instead of fewer. You have: Large blood clots. A lot of blood in your urine. Pain in  your back or lower abdomen. Pain or swelling in your legs. Chills and you are shaking. Difficulty breathing or shortness of breath. Summary After the procedure, it is common to have a small amount of blood in your urine. Avoid heavy lifting and intense physical activity for as long as told by your health care provider. Urinate as soon as you feel the need to. Do not try to hold your urine for long periods of time. Keep  all follow-up visits as told by your health care provider. This is important. This information is not intended to replace advice given to you by your health care provider. Make sure you discuss any questions you have with your health care provider. Document Revised: 04/15/2018 Document Reviewed: 09/24/2017 Elsevier Patient Education  2021 [place], Adult An indwelling urinary catheter is a thin tube that is put into your bladder. The tube helps to drain pee (urine) out of your body. The tube goes in through your urethra. Your urethra is where pee comes out of your body. Your pee will come out through the catheter, then it will go into a bag (drainage bag). Take good care of your catheter so it will work well. How to wear your catheter and bag Supplies needed  Sticky tape (adhesive tape) or a leg strap.  Alcohol wipe or soap and water (if you use tape).  A clean towel (if you use tape).  Large overnight bag.  Smaller bag (leg bag). Wearing your catheter Attach your catheter to your leg with tape or a leg strap.  Make sure the catheter is not pulled tight.  If a leg strap gets wet, take it off and put on a dry strap.  If you use tape to hold the bag on your leg: 1. Use an alcohol wipe or soap and water to wash your skin where the tape made it sticky before. 2. Use a clean towel to pat-dry that skin. 3. Use new tape to make the bag stay on your leg. Wearing your bags You should have been given a large overnight bag.  You may wear the overnight bag in the day or night.  Always have the overnight bag lower than your bladder.  Do not let the bag touch the floor.  Before you go to sleep, put a clean plastic bag in a wastebasket. Then hang the overnight bag inside the wastebasket. You should also have a smaller leg bag that fits under your clothes.  Always wear the leg bag below your knee.  Do not wear your leg bag at night. How to care for  your skin and catheter Supplies needed  A clean washcloth.  Water and mild soap.  A clean towel. Caring for your skin and catheter  Clean the skin around your catheter every day: 1. Wash your hands with soap and water. 2. Wet a clean washcloth in warm water and mild soap. 3. Clean the skin around your urethra.  If you are male:  Gently spread the folds of skin around your vagina (labia).  With the washcloth in your other hand, wipe the inner side of your labia on each side. Wipe from front to back.  If you are male:  Pull back any skin that covers the end of your penis (foreskin).  With the washcloth in your other hand, wipe your penis in small circles. Start wiping at the tip of your penis, then move away from the catheter.  Move the foreskin back in  place, if needed. 4. With your free hand, hold the catheter close to where it goes into your body.  Keep holding the catheter during cleaning so it does not get pulled out. 5. With the washcloth in your other hand, clean the catheter.  Only wipe downward on the catheter.  Do not wipe upward toward your body. Doing this may push germs into your urethra and cause infection. 6. Use a clean towel to pat-dry the catheter and the skin around it. Make sure to wipe off all soap. 7. Wash your hands with soap and water.  Shower every day. Do not take baths.  Do not use cream, ointment, or lotion on the area where the catheter goes into your body, unless your doctor tells you to.  Do not use powders, sprays, or lotions on your genital area.  Check your skin around the catheter every day for signs of infection. Check for: ? Redness, swelling, or pain. ? Fluid or blood. ? Warmth. ? Pus or a bad smell.      How to empty the bag Supplies needed  Rubbing alcohol.  Gauze pad or cotton ball.  Tape or a leg strap. Emptying the bag Pour the pee out of your bag when it is ?- full, or at least 2-3 times a day. Do this for your  overnight bag and your leg bag. 1. Wash your hands with soap and water. 2. Separate (detach) the bag from your leg. 3. Hold the bag over the toilet or a clean pail. Keep the bag lower than your hips and bladder. This is so the pee (urine) does not go back into the tube. 4. Open the pour spout. It is at the bottom of the bag. 5. Empty the pee into the toilet or pail. Do not let the pour spout touch any surface. 6. Put rubbing alcohol on a gauze pad or cotton ball. 7. Use the gauze pad or cotton ball to clean the pour spout. 8. Close the pour spout. 9. Attach the bag to your leg with tape or a leg strap. 10. Wash your hands with soap and water. Follow instructions for cleaning the drainage bag:  From the product maker.  As told by your doctor. How to change the bag Supplies needed  Alcohol wipes.  A clean bag.  Tape or a leg strap. Changing the bag Replace your bag when it starts to leak, smell bad, or look dirty. 1. Wash your hands with soap and water. 2. Separate the dirty bag from your leg. 3. Pinch the catheter with your fingers so that pee does not spill out. 4. Separate the catheter tube from the bag tube where these tubes connect (at the connection valve). Do not let the tubes touch any surface. 5. Clean the end of the catheter tube with an alcohol wipe. Use a different alcohol wipe to clean the end of the bag tube. 6. Connect the catheter tube to the tube of the clean bag. 7. Attach the clean bag to your leg with tape or a leg strap. Do not make the bag tight on your leg. 8. Wash your hands with soap and water. General rules  Never pull on your catheter. Never try to take it out. Doing that can hurt you.  Always wash your hands before and after you touch your catheter or bag. Use a mild, fragrance-free soap. If you do not have soap and water, use hand sanitizer.  Always make sure there are no twists or  bends (kinks) in the catheter tube.  Always make sure there are no  leaks in the catheter or bag.  Drink enough fluid to keep your pee pale yellow.  Do not take baths, swim, or use a hot tub.  If you are male, wipe from front to back after you poop (have a bowel movement).   Contact a doctor if:  Your pee is cloudy.  Your pee smells worse than usual.  Your catheter gets clogged.  Your catheter leaks.  Your bladder feels full. Get help right away if:  You have redness, swelling, or pain where the catheter goes into your body.  You have fluid, blood, pus, or a bad smell coming from the area where the catheter goes into your body.  Your skin feels warm where the catheter goes into your body.  You have a fever.  You have pain in your: ? Belly (abdomen). ? Legs. ? Lower back. ? Bladder.  You see blood in the catheter.  Your pee is pink or red.  You feel sick to your stomach (nauseous).  You throw up (vomit).  You have chills.  Your pee is not draining into the bag.  Your catheter gets pulled out. Summary  An indwelling urinary catheter is a thin tube that is placed into the bladder to help drain pee (urine) out of the body.  The catheter is placed into the part of the body that drains pee from the bladder (urethra).  Taking good care of your catheter will keep it working properly and help prevent problems.  Always wash your hands before and after touching your catheter or bag.  Never pull on your catheter or try to take it out. This information is not intended to replace advice given to you by your health care provider. Make sure you discuss any questions you have with your health care provider. Document Revised: 04/17/2018 Document Reviewed: 08/09/2016 Elsevier Patient Education  2021 [place] may have urinary urgency (bladder spasms) and bloody urine on / off with stent in place. This is normal.  2 - Call MD or go to ER for fever >102, severe pain / nausea / vomiting not relieved by medications, or acute  change in medical status

## 2020-02-12 NOTE — Op Note (Signed)
NAME: Brian Clarke, Brian Clarke MEDICAL RECORD [id number] ACCOUNT [id number] DATE OF BIRTH:04/06/1945 FACILITY: [facility] LOCATION: [facility] PHYSICIAN:Yula Crotwell, MD  OPERATIVE REPORT  DATE OF PROCEDURE:  02/11/2020  PREOPERATIVE DIAGNOSIS:  Prostatic hypertrophy, refractory.  PROCEDURE:  Transurethral resection of the prostate.  ESTIMATED BLOOD LOSS:  100 mL.  MEDICATIONS:  None.  SPECIMEN:  Prostate chips for permanent pathology.  PREOPERATIVE FINDINGS: 1.  Mostly bilobar prostatic hypertrophy pre-resection. 2.  Wide open prostate fossa from the bladder neck to verumontanum post-resection. 3.  Unremarkable urinary bladder, mild trabeculation.  DRAIN:  A 22-French 3-way Foley catheter to normal saline irrigation.  INDICATIONS:  The patient is a 75 year old man with history of significant metabolic comorbidity with refractory lower urinary tract symptoms for years.  He has been on maximal medical therapy with alpha blockers and 5-alpha reductase inhibitors, which  had helped for some time, but his symptoms have progressed.  Options were discussed for further management including continued medical therapy alone as he does empty well versus a path of outlet procedure, understanding that this would not completely  resolve his symptomatology and he adamantly wished to proceed.  Informed consent was obtained and placed in medical record.  DESCRIPTION OF PROCEDURE:  The patient was verified, the procedure being transurethral resection of prostate was confirmed.  Procedure timeout was performed.  Intravenous antibiotics administered, general LMA anesthesia induced.  The patient was placed  into a low lithotomy position.  Sterile field was created, prepped and draped base of the penis, perineum and proximal thighs using iodine.  Cystourethroscopy was performed using a 26-French resectoscope sheath with visual obturator.  Inspection of  anterior and posterior urethra was unremarkable.  Inspection  of the prostatic fossa revealed mostly bilobar hypertrophy, small median lobe anatomy with kissing lobes.  Next, using medium size resectoscope loop, very careful resection was performed in a  top-down orientation first at the 12 o'clock position from the bladder neck to verumontanum, then of the right lobe, then of the left lobe down to superficial fibrous tissue of the prostate capsule.  Exquisite care was taken to avoid undermining the  bladder neck.  Innumerable prostate chips were inherently generated.  These were irrigated and set aside for permanent pathology.  Following this, the entire base of the resection fossa was fulgurated using coagulation current resulted in excellent  hemostasis.  No evidence of any perforation.  A sensor wire was then advanced to the level of the urinary bladder via the resectoscope sheath and a 22-French 3-way catheter was placed over this 20 mL around the balloon.  This resulted in normal saline  irrigation, efflux very light pink in very gentle traction.  The procedure was terminated.  The patient tolerated the procedure well.  No immediate complications.  The patient was taken to postanesthesia care in stable condition with plan for observation  admission, likely discharge home tomorrow.  HN/NUANCE  D:02/11/2020 T:02/12/2020 JOB:[id number]

## 2020-02-12 NOTE — Discharge Summary (Signed)
Date of admission: 02/11/2020  Date of discharge: 02/12/2020  Admission diagnosis: Refractory BPH  Discharge diagnosis: Refractory BPH  History and Physical: For full details, please see admission history and physical. Briefly, Brian Clarke is a 75 y.o. year old patient with refractory BPH.   Hospital Course: Patient underwent cystoscopy with transurethral resection of the prostate on 02/11/20. He tolerated the procedure well. Post operatively he was continued on CBI which was weaned by POD1. Urine was clear yellow on POD1. CBI was clamped and patient was discharged with foley catheter in place, with plan to remove in clinic in 2 days.   Laboratory values: Recent Labs    02/11/20 0906 02/12/20 0525  HGB 12.6* 10.9*  HCT 37.0* 33.2*   Recent Labs    02/11/20 0906 02/12/20 0525  CREATININE 0.90 0.94    Disposition: Home  Discharge instruction: The patient was instructed to be ambulatory but told to refrain from heavy lifting, strenuous activity, or driving.   Discharge medications:  Allergies as of 02/12/2020      Reactions   Micardis [telmisartan] Swelling   Tongue swelling      Medication List    STOP taking these medications   aspirin EC 81 MG tablet   tamsulosin 0.4 MG Caps capsule Commonly known as: FLOMAX     TAKE these medications   amLODipine 5 MG tablet Commonly known as: NORVASC TAKE 1 TABLET (5 MG TOTAL) BY MOUTH DAILY AT 2 PM.   B-12 5000 MCG Caps Take by mouth daily.   cetirizine 10 MG tablet Commonly known as: ZYRTEC Take 10 mg by mouth daily.   docusate sodium 100 MG capsule Commonly known as: Colace Take 1 capsule (100 mg total) by mouth 2 (two) times daily.   finasteride 5 MG tablet Commonly known as: PROSCAR Take 1 tablet by mouth daily.   metoprolol succinate 100 MG 24 hr tablet Commonly known as: TOPROL-XL TAKE 1 TABLET BY MOUTH TWICE DAILY WITH OR IMMEDIATELY FOLLOWING A MEAL What changed: See the new instructions.    olmesartan-hydrochlorothiazide 20-12.5 MG tablet Commonly known as: BENICAR HCT TAKE 1 TABLET BY MOUTH EVERY DAY IN THE MORNING What changed: See the new instructions.   pyridoxine 100 MG tablet Commonly known as: B-6 Take 100 mg by mouth daily.   rosuvastatin 20 MG tablet Commonly known as: CRESTOR TAKE 1 TABLET BY MOUTH EVERY DAY   sulfamethoxazole-trimethoprim 800-160 MG tablet Commonly known as: BACTRIM DS Take 1 tablet by mouth 2 (two) times daily. Begin day before next Urology appointment. Notes to patient: Start taking medicine on Sunday February 13, 2020   traMADol 50 MG tablet Commonly known as: Ultram Take 1-2 tablets (50-100 mg total) by mouth every 6 (six) hours as needed for moderate pain or severe pain. Post-operatively   vitamin C 500 MG tablet Commonly known as: ASCORBIC ACID Take 500 mg by mouth daily.   Vitamin D3 125 MCG (5000 UT) Tabs Take 1 tablet by mouth daily.       Followup:   Follow-up Information    Alexis Frock, MD On 02/14/2020.   Specialty: Urology Why: at 9:15 for MD visit and office catheter removal.  Contact information: [address] [place] [id number] [phone]

## 2020-02-12 NOTE — Progress Notes (Signed)
Post-op note  POD1 s/p TURP  Subjective: The patient is doing well.  No complaints. Urine clear yellow. Ambulated  Objective: Vital signs in last 24 hours: Temp:  [97.5 F (36.4 C)-98.3 F (36.8 C)] 98.3 F (36.8 C) (02/05 0847) Pulse Rate:  [65-92] 83 (02/05 0847) Resp:  [13-18] 16 (02/05 0847) BP: (103-113)/(56-73) 113/59 (02/05 0847) SpO2:  [93 %-100 %] 98 % (02/05 0847)  Intake/Output from previous day: 02/04 0701 - 02/05 0700 In: 6090 [P.O.:1320; I.V.:1870] Out: 4160 [Urine:4150; Blood:10] Intake/Output this shift: Total I/O In: 74.2 [I.V.:74.2] Out: 850 [Urine:850]  Physical Exam:  General: Alert and oriented. Abdomen: Soft, Nondistended. GU: foley in place, urine clear yellow  Lab Results: Recent Labs    02/11/20 0906 02/12/20 0525  HGB 12.6* 10.9*  HCT 37.0* 33.2*    Assessment/Plan: POD#1 s/p TURP, urine clear  1) Clamp CBI 2) Saline lock, regular diet 3) Foley teaching prior to discharge later today     LOS: 0 days   Carmie Kanner 02/12/2020, 8:56 AM

## 2020-02-14 ENCOUNTER — Encounter (HOSPITAL_BASED_OUTPATIENT_CLINIC_OR_DEPARTMENT_OTHER): Payer: Self-pay | Admitting: Urology

## 2020-02-14 LAB — SURGICAL PATHOLOGY

## 2020-03-28 ENCOUNTER — Other Ambulatory Visit: Payer: Self-pay

## 2020-03-28 ENCOUNTER — Encounter: Payer: Self-pay | Admitting: Internal Medicine

## 2020-03-28 ENCOUNTER — Ambulatory Visit (INDEPENDENT_AMBULATORY_CARE_PROVIDER_SITE_OTHER): Payer: Medicare HMO | Admitting: Internal Medicine

## 2020-03-28 VITALS — BP 118/76 | HR 87 | Temp 97.1°F | Ht 67.5 in | Wt 255.0 lb

## 2020-03-28 DIAGNOSIS — C61 Malignant neoplasm of prostate: Secondary | ICD-10-CM | POA: Diagnosis not present

## 2020-03-28 DIAGNOSIS — I10 Essential (primary) hypertension: Secondary | ICD-10-CM

## 2020-03-28 DIAGNOSIS — Z7189 Other specified counseling: Secondary | ICD-10-CM

## 2020-03-28 DIAGNOSIS — I779 Disorder of arteries and arterioles, unspecified: Secondary | ICD-10-CM

## 2020-03-28 DIAGNOSIS — I739 Peripheral vascular disease, unspecified: Secondary | ICD-10-CM

## 2020-03-28 DIAGNOSIS — Z0000 Encounter for general adult medical examination without abnormal findings: Secondary | ICD-10-CM

## 2020-03-28 LAB — CBC
HCT: 41.1 % (ref 39.0–52.0)
Hemoglobin: 13.8 g/dL (ref 13.0–17.0)
MCHC: 33.5 g/dL (ref 30.0–36.0)
MCV: 100.1 fl — ABNORMAL HIGH (ref 78.0–100.0)
Platelets: 228 10*3/uL (ref 150.0–400.0)
RBC: 4.1 Mil/uL — ABNORMAL LOW (ref 4.22–5.81)
RDW: 13.2 % (ref 11.5–15.5)
WBC: 6.8 10*3/uL (ref 4.0–10.5)

## 2020-03-28 LAB — COMPREHENSIVE METABOLIC PANEL
ALT: 24 U/L (ref 0–53)
AST: 20 U/L (ref 0–37)
Albumin: 4.4 g/dL (ref 3.5–5.2)
Alkaline Phosphatase: 70 U/L (ref 39–117)
BUN: 28 mg/dL — ABNORMAL HIGH (ref 6–23)
CO2: 27 mEq/L (ref 19–32)
Calcium: 10.7 mg/dL — ABNORMAL HIGH (ref 8.4–10.5)
Chloride: 106 mEq/L (ref 96–112)
Creatinine, Ser: 1.11 mg/dL (ref 0.40–1.50)
GFR: 65.6 mL/min (ref >60.00)
Glucose, Bld: 110 mg/dL — ABNORMAL HIGH (ref 70–99)
Potassium: 4.5 mEq/L (ref 3.5–5.1)
Sodium: 141 mEq/L (ref 135–145)
Total Bilirubin: 0.4 mg/dL (ref 0.2–1.2)
Total Protein: 7 g/dL (ref 6.0–8.3)

## 2020-03-28 LAB — LIPID PANEL
Cholesterol: 165 mg/dL (ref 0–200)
HDL: 40.2 mg/dL (ref >39.00)
NonHDL: 125.13
Total CHOL/HDL Ratio: 4
Triglycerides: 299 mg/dL — ABNORMAL HIGH (ref 0.0–149.0)
VLDL: 59.8 mg/dL — ABNORMAL HIGH (ref 0.0–40.0)

## 2020-03-28 LAB — T4, FREE: Free T4: 0.83 ng/dL (ref 0.60–1.60)

## 2020-03-28 LAB — HEMOGLOBIN A1C: Hgb A1c MFr Bld: 5.6 % (ref 4.6–6.5)

## 2020-03-28 LAB — LDL CHOLESTEROL, DIRECT: Direct LDL: 80 mg/dL

## 2020-03-28 LAB — PSA: PSA: 0.49 ng/mL (ref 0.10–4.00)

## 2020-03-28 NOTE — Assessment & Plan Note (Signed)
BP Readings from Last 3 Encounters:  03/28/20 118/76  02/12/20 (!) 113/59  10/27/19 133/71   Good control on olmesartan/HCTZ/amlodipine Will recheck labs

## 2020-03-28 NOTE — Assessment & Plan Note (Signed)
Bilateral stenosis Is on statin, ASA and BP controlled

## 2020-03-28 NOTE — Assessment & Plan Note (Signed)
See social history 

## 2020-03-28 NOTE — Assessment & Plan Note (Signed)
I have personally reviewed the Medicare Annual Wellness questionnaire and have noted 1. The patient's medical and social history 2. Their use of alcohol, tobacco or illicit drugs 3. Their current medications and supplements 4. The patient's functional ability including ADL's, fall risks, home safety risks and hearing or visual             impairment. 5. Diet and physical activities 6. Evidence for depression or mood disorders  The patients weight, height, BMI and visual acuity have been recorded in the chart I have made referrals, counseling and provided education to the patient based review of the above and I have provided the pt with a written personalized care plan for preventive services.  I have provided you with a copy of your personalized plan for preventive services. Please take the time to review along with your updated medication list.  Still prefers no COVID, flu or shingles vaccines Vital that he cut out the few cigarettes Increase activity--can go back to [facility] Done with cancer screening

## 2020-03-28 NOTE — Assessment & Plan Note (Signed)
Stable claudication Is on the statin (rosuvastatin)

## 2020-03-28 NOTE — Assessment & Plan Note (Signed)
5% Gleason 3+4 in the pathology sample from TURP Hasn't reviewed this with urologist yet--coming up soon Not clear that further action is indicated
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
## 2020-04-06 ENCOUNTER — Ambulatory Visit: Payer: Medicare HMO | Admitting: Cardiology

## 2020-04-06 ENCOUNTER — Encounter: Payer: Self-pay | Admitting: Cardiology

## 2020-04-06 ENCOUNTER — Other Ambulatory Visit: Payer: Self-pay

## 2020-04-06 VITALS — BP 114/72 | HR 88 | Temp 98.6°F | Resp 16 | Ht 67.5 in | Wt 258.2 lb

## 2020-04-06 DIAGNOSIS — I739 Peripheral vascular disease, unspecified: Secondary | ICD-10-CM

## 2020-04-06 DIAGNOSIS — E782 Mixed hyperlipidemia: Secondary | ICD-10-CM | POA: Diagnosis not present

## 2020-04-06 DIAGNOSIS — Z6839 Body mass index (BMI) 39.0-39.9, adult: Secondary | ICD-10-CM | POA: Diagnosis not present

## 2020-04-06 DIAGNOSIS — I6523 Occlusion and stenosis of bilateral carotid arteries: Secondary | ICD-10-CM | POA: Diagnosis not present

## 2020-04-06 DIAGNOSIS — I10 Essential (primary) hypertension: Secondary | ICD-10-CM

## 2020-04-06 MED ORDER — OLMESARTAN MEDOXOMIL-HCTZ 40-25 MG PO TABS: 1.0000 | ORAL_TABLET | ORAL | 3 refills | Status: DC
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
## 2020-04-14 ENCOUNTER — Other Ambulatory Visit: Payer: Medicare HMO

## 2020-04-20 ENCOUNTER — Other Ambulatory Visit: Payer: Self-pay

## 2020-04-20 ENCOUNTER — Other Ambulatory Visit: Payer: Medicare HMO

## 2020-04-20 ENCOUNTER — Ambulatory Visit: Payer: Medicare HMO

## 2020-04-20 DIAGNOSIS — I6523 Occlusion and stenosis of bilateral carotid arteries: Secondary | ICD-10-CM | POA: Diagnosis not present

## 2020-04-20 DIAGNOSIS — I739 Peripheral vascular disease, unspecified: Secondary | ICD-10-CM

## 2020-04-21 ENCOUNTER — Ambulatory Visit: Payer: Medicare HMO | Admitting: Cardiology

## 2020-04-25 ENCOUNTER — Other Ambulatory Visit: Payer: Self-pay | Admitting: Cardiology

## 2020-04-25 DIAGNOSIS — I10 Essential (primary) hypertension: Secondary | ICD-10-CM | POA: Diagnosis not present

## 2020-04-25 DIAGNOSIS — I6523 Occlusion and stenosis of bilateral carotid arteries: Secondary | ICD-10-CM

## 2020-04-26 LAB — BASIC METABOLIC PANEL
BUN/Creatinine Ratio: 21 (ref 10–24)
BUN: 24 mg/dL (ref 8–27)
CO2: 20 mmol/L (ref 20–29)
Calcium: 10.7 mg/dL — ABNORMAL HIGH (ref 8.6–10.2)
Chloride: 103 mmol/L (ref 96–106)
Creatinine, Ser: 1.12 mg/dL (ref 0.76–1.27)
Glucose: 98 mg/dL (ref 65–99)
Potassium: 4.6 mmol/L (ref 3.5–5.2)
Sodium: 138 mmol/L (ref 134–144)
eGFR: 69 mL/min/{1.73_m2} (ref >59)

## 2020-04-26 LAB — CBC
Hematocrit: 41.2 % (ref 37.5–51.0)
Hemoglobin: 14.2 g/dL (ref 13.0–17.7)
MCH: 33.3 pg — ABNORMAL HIGH (ref 26.6–33.0)
MCHC: 34.5 g/dL (ref 31.5–35.7)
MCV: 97 fL (ref 79–97)
Platelets: 220 10*3/uL (ref 150–450)
RBC: 4.27 x10E6/uL (ref 4.14–5.80)
RDW: 12.1 % (ref 11.6–15.4)
WBC: 7.4 10*3/uL (ref 3.4–10.8)

## 2020-04-28 ENCOUNTER — Telehealth: Payer: Self-pay

## 2020-04-28 ENCOUNTER — Other Ambulatory Visit (HOSPITAL_COMMUNITY)
Admission: RE | Admit: 2020-04-28 | Discharge: 2020-04-28 | Disposition: A | Discharge status: Home / Self Care | Payer: Medicare HMO | Source: Ambulatory Visit | Attending: Cardiology | Admitting: Cardiology

## 2020-04-28 DIAGNOSIS — Z20822 Contact with and (suspected) exposure to covid-19: Secondary | ICD-10-CM | POA: Diagnosis not present

## 2020-04-28 DIAGNOSIS — Z01812 Encounter for preprocedural laboratory examination: Secondary | ICD-10-CM | POA: Insufficient documentation

## 2020-04-28 NOTE — Telephone Encounter (Signed)
No, it is an out patient procedure and will need rescheduling if unable to be present. Only if there is a complications, he will have to stay

## 2020-04-28 NOTE — Telephone Encounter (Signed)
Pt's wife called to ask if pt can be kept overnight after his upcoming procedure because she is going to be out of town and will not be able to be with him.

## 2020-04-29 LAB — SARS CORONAVIRUS 2 (TAT 6-24 HRS): SARS Coronavirus 2: NEGATIVE

## 2020-05-02 ENCOUNTER — Encounter (HOSPITAL_COMMUNITY)
Admission: RE | Disposition: A | Discharge status: Home / Self Care | Payer: Self-pay | Source: Home / Self Care | Attending: Cardiology

## 2020-05-02 ENCOUNTER — Ambulatory Visit (HOSPITAL_COMMUNITY)
Admission: RE | Admit: 2020-05-02 | Discharge: 2020-05-02 | Disposition: A | Discharge status: Home / Self Care | Payer: Medicare HMO | Attending: Cardiology | Admitting: Cardiology

## 2020-05-02 ENCOUNTER — Encounter (HOSPITAL_COMMUNITY): Payer: Self-pay | Admitting: Cardiology

## 2020-05-02 DIAGNOSIS — I739 Peripheral vascular disease, unspecified: Secondary | ICD-10-CM | POA: Diagnosis not present

## 2020-05-02 DIAGNOSIS — I70213 Atherosclerosis of native arteries of extremities with intermittent claudication, bilateral legs: Secondary | ICD-10-CM | POA: Insufficient documentation

## 2020-05-02 HISTORY — PX: ABDOMINAL AORTOGRAM W/LOWER EXTREMITY: CATH118223

## 2020-05-02 SURGERY — ABDOMINAL AORTOGRAM W/LOWER EXTREMITY
Anesthesia: LOCAL | Laterality: Bilateral

## 2020-05-02 MED ORDER — ACETAMINOPHEN 325 MG PO TABS: 650.0000 mg | ORAL_TABLET | ORAL | Status: DC | PRN

## 2020-05-02 MED ORDER — ONDANSETRON HCL 4 MG/2ML IJ SOLN: 4.0000 mg | Freq: Four times a day (QID) | INTRAMUSCULAR | Status: DC | PRN

## 2020-05-02 MED ORDER — SODIUM CHLORIDE 0.9% FLUSH: 3.0000 mL | Freq: Two times a day (BID) | INTRAVENOUS | Status: DC

## 2020-05-02 MED ORDER — SODIUM CHLORIDE 0.9% FLUSH: 3.0000 mL | INTRAVENOUS | Status: DC | PRN

## 2020-05-02 MED ORDER — HEPARIN (PORCINE) IN NACL 1000-0.9 UT/500ML-% IV SOLN
INTRAVENOUS | Status: AC
  Filled 2020-05-02: qty 1000

## 2020-05-02 MED ORDER — LIDOCAINE HCL (PF) 1 % IJ SOLN
INTRAMUSCULAR | Status: AC
  Filled 2020-05-02: qty 30

## 2020-05-02 MED ORDER — SODIUM CHLORIDE 0.9 % IV SOLN: 250.0000 mL | INTRAVENOUS | Status: DC | PRN

## 2020-05-02 MED ORDER — SODIUM CHLORIDE 0.9 % IV SOLN: INTRAVENOUS | Status: DC

## 2020-05-02 MED ORDER — HEPARIN (PORCINE) IN NACL 1000-0.9 UT/500ML-% IV SOLN
INTRAVENOUS | Status: DC | PRN
  Administered 2020-05-02 (×2): 500 mL

## 2020-05-02 MED ORDER — IODIXANOL 320 MG/ML IV SOLN
INTRAVENOUS | Status: DC | PRN
  Administered 2020-05-02: 85 mL

## 2020-05-02 MED ORDER — SODIUM CHLORIDE 0.9 % WEIGHT BASED INFUSION: 1.0000 mL/kg/h | INTRAVENOUS | Status: DC

## 2020-05-02 MED ORDER — FENTANYL CITRATE (PF) 100 MCG/2ML IJ SOLN
INTRAMUSCULAR | Status: AC
  Filled 2020-05-02: qty 2

## 2020-05-02 MED ORDER — MIDAZOLAM HCL 2 MG/2ML IJ SOLN
INTRAMUSCULAR | Status: DC | PRN
  Administered 2020-05-02: 2 mg via INTRAVENOUS

## 2020-05-02 MED ORDER — FENTANYL CITRATE (PF) 100 MCG/2ML IJ SOLN
INTRAMUSCULAR | Status: DC | PRN
  Administered 2020-05-02: 50 ug via INTRAVENOUS

## 2020-05-02 MED ORDER — MIDAZOLAM HCL 2 MG/2ML IJ SOLN
INTRAMUSCULAR | Status: AC
  Filled 2020-05-02: qty 2

## 2020-05-02 MED ORDER — LIDOCAINE HCL (PF) 1 % IJ SOLN
INTRAMUSCULAR | Status: DC | PRN
  Administered 2020-05-02: 18 mL

## 2020-05-02 SURGICAL SUPPLY — 12 items
CATH OMNI FLUSH 5F 65CM (CATHETERS) ×1 IMPLANT
CLOSURE MYNX CONTROL 5F (Vascular Products) ×1 IMPLANT
KIT MICROPUNCTURE NIT STIFF (SHEATH) ×1 IMPLANT
KIT PV (KITS) ×2 IMPLANT
SHEATH PINNACLE 5F 10CM (SHEATH) ×1 IMPLANT
SHEATH PROBE COVER 6X72 (BAG) ×1 IMPLANT
STOPCOCK MORSE 400PSI 3WAY (MISCELLANEOUS) ×1 IMPLANT
SYR MEDRAD MARK 7 150ML (SYRINGE) ×2 IMPLANT
TRANSDUCER W/STOPCOCK (MISCELLANEOUS) ×2 IMPLANT
TRAY PV CATH (CUSTOM PROCEDURE TRAY) ×2 IMPLANT
TUBING CIL FLEX 10 FLL-RA (TUBING) ×1 IMPLANT
WIRE HITORQ VERSACORE ST 145CM (WIRE) ×1 IMPLANT

## 2020-05-02 NOTE — Discharge Instructions (Signed)
Femoral Site Care  This sheet gives you information about how to care for yourself after your procedure. Your health care provider may also give you more specific instructions. If you have problems or questions, contact your health care provider. What can I expect after the procedure? After the procedure, it is common to have:  Bruising that usually fades within 1-2 weeks.  Tenderness at the site. Follow these instructions at home: Wound care  Follow instructions from your health care provider about how to take care of your insertion site. Make sure you: ? Wash your hands with soap and water before you change your bandage (dressing). If soap and water are not available, use hand sanitizer. ? Change your dressing as told by your health care provider. ? Leave stitches (sutures), skin glue, or adhesive strips in place. These skin closures may need to stay in place for 2 weeks or longer. If adhesive strip edges start to loosen and curl up, you may trim the loose edges. Do not remove adhesive strips completely unless your health care provider tells you to do that.  Do not take baths, swim, or use a hot tub until your health care provider approves.  You may shower 24-48 hours after the procedure or as told by your health care provider. ? Gently wash the site with plain soap and water. ? Pat the area dry with a clean towel. ? Do not rub the site. This may cause bleeding.  Do not apply powder or lotion to the site. Keep the site clean and dry.  Check your femoral site every day for signs of infection. Check for: ? Redness, swelling, or pain. ? Fluid or blood. ? Warmth. ? Pus or a bad smell. Activity  For the first 2-3 days after your procedure, or as long as directed: ? Avoid climbing stairs as much as possible. ? Do not squat.  Do not lift anything that is heavier than 10 lb (4.5 kg), or the limit that you are told, until your health care provider says that it is safe.  Rest as  directed. ? Avoid sitting for a long time without moving. Get up to take short walks every 1-2 hours.  Do not drive for 24 hours if you were given a medicine to help you relax (sedative). General instructions  Take over-the-counter and prescription medicines only as told by your health care provider.  Keep all follow-up visits as told by your health care provider. This is important. Contact a health care provider if you have:  A fever or chills.  You have redness, swelling, or pain around your insertion site. Get help right away if:  The catheter insertion area swells very fast.  You pass out.  You suddenly start to sweat or your skin gets clammy.  The catheter insertion area is bleeding, and the bleeding does not stop when you hold steady pressure on the area.  The area near or just beyond the catheter insertion site becomes pale, cool, tingly, or numb. These symptoms may represent a serious problem that is an emergency. Do not wait to see if the symptoms will go away. Get medical help right away. Call your local emergency services (911 in the [place] Do not drive yourself to the hospital. Summary  After the procedure, it is common to have bruising that usually fades within 1-2 weeks.  Check your femoral site every day for signs of infection.  Do not lift anything that is heavier than 10 lb (4.5 kg), or   the limit that you are told, until your health care provider says that it is safe. This information is not intended to replace advice given to you by your health care provider. Make sure you discuss any questions you have with your health care provider. Document Revised: 08/27/2019 Document Reviewed: 08/27/2019 Elsevier Patient Education  2021 [employer]  

## 2020-05-08 DIAGNOSIS — R350 Frequency of micturition: Secondary | ICD-10-CM | POA: Diagnosis not present

## 2020-05-23 ENCOUNTER — Ambulatory Visit: Payer: Medicare HMO | Admitting: Cardiology

## 2020-05-23 ENCOUNTER — Other Ambulatory Visit: Payer: Self-pay

## 2020-05-23 ENCOUNTER — Encounter: Payer: Self-pay | Admitting: Cardiology

## 2020-05-23 VITALS — BP 137/73 | HR 74 | Temp 97.3°F | Resp 16 | Ht 67.5 in | Wt 260.0 lb

## 2020-05-23 DIAGNOSIS — I739 Peripheral vascular disease, unspecified: Secondary | ICD-10-CM | POA: Diagnosis not present

## 2020-05-23 DIAGNOSIS — E782 Mixed hyperlipidemia: Secondary | ICD-10-CM | POA: Diagnosis not present

## 2020-05-23 DIAGNOSIS — I6523 Occlusion and stenosis of bilateral carotid arteries: Secondary | ICD-10-CM

## 2020-05-23 DIAGNOSIS — I10 Essential (primary) hypertension: Secondary | ICD-10-CM

## 2020-05-23 MED ORDER — CLOPIDOGREL BISULFATE 75 MG PO TABS
75.0000 mg | ORAL_TABLET | Freq: Every day | ORAL | 11 refills | Status: DC
Start: 2020-05-23 — End: 2020-08-25

## 2020-05-23 MED ORDER — ICOSAPENT ETHYL 1 G PO CAPS: 2.0000 g | ORAL_CAPSULE | Freq: Two times a day (BID) | ORAL | 3 refills | Status: DC
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
## 2020-05-24 ENCOUNTER — Other Ambulatory Visit: Payer: Self-pay

## 2020-05-24 MED ORDER — ICOSAPENT ETHYL 1 G PO CAPS: 2.0000 g | ORAL_CAPSULE | Freq: Two times a day (BID) | ORAL | 3 refills | Status: DC

## 2020-07-12 ENCOUNTER — Telehealth: Payer: Self-pay | Admitting: *Deleted

## 2020-07-12 NOTE — Chronic Care Management (AMB) (Signed)
  Chronic Care Management   Outreach Note  07/12/2020 Name: KAYMEN ADRIAN MRN: [id number] DOB: October 02, 1945  IVON OELKERS is a 75 y.o. year old male who is a primary care patient of [name], MD. I reached out to Arvella Merles [facility] by phone today in response to a referral sent by Mr. Hayven Fatima [facility] PCP [name], MD     An unsuccessful telephone outreach was attempted today. The patient was referred to the case management team for assistance with care management and care coordination.   Follow Up Plan: A HIPAA compliant phone message was left for the patient providing contact information and requesting a return call.  If patient returns call to provider office, please advise to call Embedded Care Management Care Guide Iyani Dresner at [place], [place] [facility]  Direct Dial: [phone]

## 2020-07-24 NOTE — Chronic Care Management (AMB) (Signed)
  Chronic Care Management   Outreach Note  07/24/2020 Name: Brian Clarke MRN: [id number] DOB: 1945/07/02  Brian Clarke is a 75 y.o. year old male who is a primary care patient of [name], MD. I reached out to Brian Clarke [facility] by phone today in response to a referral sent by Mr. Brian Clarke [facility] PCP [name], MD     Spoke with Brian Clarke (wife DPR) and will have pt call us back to schedule if he is interested, pt works part time and wife doesn't know his schedule   Follow Up Plan: The care management team will reach out to the patient again over the next 14 days, if pt doesn't return call   Julian Hy, [place]: [phone]

## 2020-07-25 NOTE — Telephone Encounter (Signed)
MR. Brian Clarke CALLED IN AND RETURNING PHONE CALL FOR WHO HAS FOR THE PAST 2 WEEKS. HE STATED THAT HE WANTED TO CANCEL ANY REFERRAL

## 2020-07-25 NOTE — Chronic Care Management (AMB) (Signed)
  Chronic Care Management   Note  07/25/2020 Name: ALANZO LAMB MRN: [id number] DOB: 1945-02-28  NIL XIONG is a 75 y.o. year old male who is a primary care patient of [name], MD. I reached out to Arvella Merles [facility] by phone today in response to a referral sent by Mr. Abass Misener [facility] PCP [name], MD     Mr. Courville was given information about Chronic Care Management services today including:  CCM service includes personalized support from designated clinical staff supervised by his physician, including individualized plan of care and coordination with other care providers 24/7 contact phone numbers for assistance for urgent and routine care needs. Service will only be billed when office clinical staff spend 20 minutes or more in a month to coordinate care. Only one practitioner may furnish and bill the service in a calendar month. The patient may stop CCM services at any time (effective at the end of the month) by phone call to the office staff. The patient will be responsible for cost sharing (co-pay) of up to 20% of the service fee (after annual deductible is met).  Patient did not agree to enrollment in care management services and does not wish to consider at this time.  Follow up plan: Patient declines further follow up and engagement by the care management team. Appropriate care team members and provider have been notified via electronic communication.   Julian Hy, [place] [facility]  Direct Dial: [phone]
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
## 2020-08-23 ENCOUNTER — Other Ambulatory Visit: Payer: Self-pay

## 2020-08-23 ENCOUNTER — Encounter: Payer: Self-pay | Admitting: Student

## 2020-08-23 ENCOUNTER — Ambulatory Visit: Payer: Medicare HMO | Admitting: Student

## 2020-08-23 VITALS — BP 117/74 | HR 72 | Temp 98.3°F | Resp 16 | Ht 67.5 in | Wt 257.6 lb

## 2020-08-23 DIAGNOSIS — E782 Mixed hyperlipidemia: Secondary | ICD-10-CM

## 2020-08-23 DIAGNOSIS — I739 Peripheral vascular disease, unspecified: Secondary | ICD-10-CM | POA: Diagnosis not present

## 2020-08-23 DIAGNOSIS — I10 Essential (primary) hypertension: Secondary | ICD-10-CM

## 2020-08-23 DIAGNOSIS — I340 Nonrheumatic mitral (valve) insufficiency: Secondary | ICD-10-CM | POA: Diagnosis not present

## 2020-08-23 DIAGNOSIS — I6523 Occlusion and stenosis of bilateral carotid arteries: Secondary | ICD-10-CM
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
## 2020-08-25 ENCOUNTER — Telehealth: Payer: Self-pay

## 2020-08-25 ENCOUNTER — Other Ambulatory Visit: Payer: Self-pay

## 2020-08-25 MED ORDER — CLOPIDOGREL BISULFATE 75 MG PO TABS: 75.0000 mg | ORAL_TABLET | Freq: Every day | ORAL | 3 refills | Status: DC

## 2020-08-25 NOTE — Telephone Encounter (Signed)
-----   Message from Alethia Berthold, [place] sent at 08/24/2020  3:07 PM EDT ----- Regarding: Med Rec Please call patient's wife and complete medication reconciliation, please notify me when med list has been updated accordingly.

## 2020-08-25 NOTE — Telephone Encounter (Signed)
S/w pt's wife all meds are correct except pt is not on plavix due to bruising. I told wife the importance of the plavix and it may help with his leg pain, she will speak to patient and let me know if he is willing to restart it.

## 2020-08-27 ENCOUNTER — Other Ambulatory Visit: Payer: Self-pay | Admitting: Internal Medicine

## 2020-09-12 DIAGNOSIS — E782 Mixed hyperlipidemia: Secondary | ICD-10-CM | POA: Diagnosis not present

## 2020-09-13 LAB — LIPID PANEL WITH LDL/HDL RATIO
Cholesterol, Total: 135 mg/dL (ref 100–199)
HDL: 33 mg/dL — ABNORMAL LOW (ref >39)
LDL Chol Calc (NIH): 65 mg/dL (ref 0–99)
LDL/HDL Ratio: 2 ratio (ref 0.0–3.6)
Triglycerides: 227 mg/dL — ABNORMAL HIGH (ref 0–149)
VLDL Cholesterol Cal: 37 mg/dL (ref 5–40)

## 2020-09-15 NOTE — Progress Notes (Signed)
Called pt to inform him about his lab results. And advise him to continue taking vascepa 2g BID pt understood

## 2020-10-16 ENCOUNTER — Other Ambulatory Visit: Payer: Self-pay | Admitting: Cardiology

## 2020-10-18 NOTE — Telephone Encounter (Signed)
Would you like to change to an alternative medication?

## 2020-10-24 NOTE — Telephone Encounter (Signed)
Recommend Vascepa for hypertriglyceridemia.  If it is cost prohibitive through that pharmacy consider using blank Rx.  Let me know if I need to send new prescription for Vascepa.

## 2020-10-27 ENCOUNTER — Other Ambulatory Visit: Payer: Self-pay

## 2020-10-27 ENCOUNTER — Ambulatory Visit: Payer: Medicare HMO

## 2020-10-27 DIAGNOSIS — I6523 Occlusion and stenosis of bilateral carotid arteries: Secondary | ICD-10-CM | POA: Diagnosis not present

## 2020-10-27 DIAGNOSIS — I340 Nonrheumatic mitral (valve) insufficiency: Secondary | ICD-10-CM | POA: Diagnosis not present

## 2020-10-30 NOTE — Progress Notes (Signed)
You are seeing him in Feb, you can order repeat study then

## 2020-11-12 ENCOUNTER — Other Ambulatory Visit: Payer: Self-pay | Admitting: Cardiology

## 2020-11-12 DIAGNOSIS — E7800 Pure hypercholesterolemia, unspecified: Secondary | ICD-10-CM

## 2020-11-20 ENCOUNTER — Other Ambulatory Visit: Payer: Self-pay

## 2020-11-20 MED ORDER — ICOSAPENT ETHYL 1 G PO CAPS: 2.0000 g | ORAL_CAPSULE | Freq: Two times a day (BID) | ORAL | 3 refills | Status: DC

## 2020-12-25 NOTE — Telephone Encounter (Signed)
Patient is now on Crestor

## 2020-12-27 ENCOUNTER — Other Ambulatory Visit: Payer: Self-pay | Admitting: Cardiology

## 2020-12-27 ENCOUNTER — Other Ambulatory Visit: Payer: Self-pay | Admitting: Student

## 2020-12-27 DIAGNOSIS — I10 Essential (primary) hypertension: Secondary | ICD-10-CM

## 2021-02-22 ENCOUNTER — Other Ambulatory Visit: Payer: Self-pay | Admitting: Cardiology

## 2021-02-22 DIAGNOSIS — I10 Essential (primary) hypertension: Secondary | ICD-10-CM
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
## 2021-02-23 ENCOUNTER — Other Ambulatory Visit: Payer: Self-pay

## 2021-02-23 ENCOUNTER — Ambulatory Visit: Payer: Medicare HMO | Admitting: Student

## 2021-02-23 DIAGNOSIS — E782 Mixed hyperlipidemia: Secondary | ICD-10-CM

## 2021-02-23 DIAGNOSIS — I739 Peripheral vascular disease, unspecified: Secondary | ICD-10-CM

## 2021-02-23 DIAGNOSIS — I6523 Occlusion and stenosis of bilateral carotid arteries: Secondary | ICD-10-CM

## 2021-03-27 ENCOUNTER — Other Ambulatory Visit: Payer: Self-pay

## 2021-03-27 ENCOUNTER — Encounter: Payer: Self-pay | Admitting: Cardiology

## 2021-03-27 ENCOUNTER — Ambulatory Visit: Payer: Medicare HMO | Admitting: Cardiology

## 2021-03-27 VITALS — BP 120/66 | HR 67 | Temp 98.0°F | Resp 16 | Ht 67.5 in | Wt 245.8 lb

## 2021-03-27 DIAGNOSIS — I739 Peripheral vascular disease, unspecified: Secondary | ICD-10-CM

## 2021-03-27 DIAGNOSIS — I6523 Occlusion and stenosis of bilateral carotid arteries: Secondary | ICD-10-CM

## 2021-03-27 DIAGNOSIS — E559 Vitamin D deficiency, unspecified: Secondary | ICD-10-CM

## 2021-03-27 DIAGNOSIS — E782 Mixed hyperlipidemia: Secondary | ICD-10-CM

## 2021-03-27 DIAGNOSIS — I10 Essential (primary) hypertension: Secondary | ICD-10-CM

## 2021-03-27 NOTE — Progress Notes (Signed)
? ?Primary Physician/Referring:  [name], MD ? ?Patient ID: Brian Clarke, male    DOB: 04/27/1945, 76 y.o.   MRN: [id number] ? ?Chief Complaint  ?Patient presents with  ? Carotid Stenosis  ? PAD  ? Follow-up  ?  6 months  ? ?HPI:   ? ?Brian Clarke  is a 76 y.o.  Caucasian male patient with peripheral artery disease, peripheral neuropathy related to alcohol, prior 58 pack tobacco use disorder, presently using vape, asymptomatic bilateral carotid artery stenosis, hyperlipidemia and PAD with PTA of left SFA on 09/07/2013. Repeat angiography on 05/02/2020 revealed patent angioplasty site below the left knee he had one vessel runoff and right leg showed mild diffuse disease and 2 vessel r/o.  ? ?Patient presents for 75-monthfollow-up.  Patient's symptoms of claudication and dyspnea on exertion are stable.  His pain in his left hip and also in his left knee is elected to severe degenerative joint disease ? ?Since last office visit he has quit smoking cigarettes but he is now using vape and states that he is trying to slowly wean himself off of it.  He used to be a heavy alcohol drinker, has reduced significantly over the last 1 year or so, he continues to drink 2 alcoholic beverages daily.  He is not exercising on a regular basis.  Except for pain in his left hip worse than the right and also bilateral calves with activity, denies chest pain, palpitations, syncope, near syncope, dizziness.  Patient's dyspnea on exertion remains stable.  States that symptoms of claudication has remained stable.  But his left hip has been bothering him significantly lately.  ? ?Past Medical History:  ?Diagnosis Date  ? Anxiety   ? Arthritis   ? thumbs   ? Bilateral lower extremity edema   ? BPH (benign prostatic hypertrophy)   ? BPH with obstruction/lower urinary tract symptoms   ? Carotid stenosis, asymptomatic, bilateral   ? Claudication in peripheral vascular disease ([place]   ? DOE (dyspnea on exertion)   ? GERD (gastroesophageal  reflux disease)   ? occasional  ? History of kidney stones   ? Hyperlipidemia   ? Hypertension   ? followed by pcp and cardiology  ? Nephrolithiasis   ? Peripheral neuropathy   ? related to alcohol and tobacco   ? Personal history of colonic polyps   ? [place]VD [place] disease) [place]   ? 09-07-2013  s/p  drug-luting balloon angioplasy Left SFA and  right SFA- mild to moderate disease  ? Renal cyst urologist--- dr mTresa Moore ? bilateral non-complex renal cysts, chronic   ? ?Social History  ? ?Tobacco Use  ? Smoking status: Former  ?  Packs/day: 1.00  ?  Years: 58.00  ?  Pack years: 58.00  ?  Types: Cigarettes  ?  Quit date: 01/07/2017  ?  Years since quitting: 4.2  ? Smokeless tobacco: Never  ?Substance Use Topics  ? Alcohol use: Yes  ?  Alcohol/week: 2.0 standard drinks  ?  Types: 2 Standard drinks or equivalent per week  ? Marital status: Married  ? ?ROS  ?Review of Systems  ?Cardiovascular:  Positive for claudication and leg swelling. Negative for chest pain and dyspnea on exertion.  ?Musculoskeletal:  Positive for arthritis.  ?Objective  ? ?Vitals with BMI 03/27/2021 08/23/2020 05/23/2020  ?Height 5' 7.5" 5' 7.5" 5' 7.5"  ?Weight 245 lbs 13 oz 257 lbs 10 oz 260 lbs  ?BMI 37.91 39.73 40.1  ?Systolic  120 117 137  ?Diastolic 66 74 73  ?Pulse 67 72 74  ?  ?Blood pressure 120/66, pulse 67, temperature 98 ?F (36.7 ?C), temperature source Temporal, resp. rate 16, height 5' 7.5" (1.715 m), weight 245 lb 12.8 oz (111.5 kg), SpO2 96 %. Body mass index is 37.93 kg/m?. ?  ?Physical Exam ?Constitutional:   ?   Comments: Well-built and moderately obese in no acute distress.  ?Neck:  ?   Vascular: No JVD.  ?Cardiovascular:  ?   Rate and Rhythm: Normal rate and regular rhythm.  ?   Pulses:     ?     Carotid pulses are 2+ on the right side with bruit and 2+ on the left side. ?     Radial pulses are 2+ on the right side and 2+ on the left side.  ?     Femoral pulses are 2+ on the right side and 2+ on the left side. ?     Popliteal  pulses are 0 on the right side and 0 on the left side.  ?     Dorsalis pedis pulses are 0 on the right side and 0 on the left side.  ?     Posterior tibial pulses are 0 on the right side and 0 on the left side.  ?   Heart sounds: Normal heart sounds. No murmur heard. ?  No gallop.  ?Pulmonary:  ?   Effort: Pulmonary effort is normal.  ?   Breath sounds: Normal breath sounds.  ?Abdominal:  ?   General: Bowel sounds are normal.  ?   Palpations: Abdomen is soft.  ?   Comments: Obese  ?Musculoskeletal:  ?   Right lower leg: Edema (1+ pitting) present.  ?   Left lower leg: Edema (1+pitting) present.  ? ?Laboratory examination:  ? ?Recent Labs  ?  03/28/20 ?8563 04/25/20 ?1017  ?NA 141 138  ?K 4.5 4.6  ?CL 106 103  ?CO2 27 20  ?GLUCOSE 110* 98  ?BUN 28* 24  ?CREATININE 1.11 1.12  ?CALCIUM 10.7* 10.7*  ? ?CMP Latest Ref Rng & Units 04/25/2020 03/28/2020 02/12/2020  ?Glucose 65 - 99 mg/dL 98 110(H) 162(H)  ?BUN 8 - 27 mg/dL 24 28(H) 23  ?Creatinine 0.76 - 1.27 mg/dL 1.12 1.11 0.94  ?Sodium 134 - 144 mmol/L 138 141 138  ?Potassium 3.5 - 5.2 mmol/L 4.6 4.5 4.7  ?Chloride 96 - 106 mmol/L 103 106 107  ?CO2 20 - 29 mmol/L '20 27 23  '$ ?Calcium 8.6 - 10.2 mg/dL 10.7(H) 10.7(H) 9.7  ?Total Protein 6.0 - 8.3 g/dL - 7.0 -  ?Total Bilirubin 0.2 - 1.2 mg/dL - 0.4 -  ?Alkaline Phos 39 - 117 U/L - 70 -  ?AST 0 - 37 U/L - 20 -  ?ALT 0 - 53 U/L - 24 -  ? ?CBC Latest Ref Rng & Units 04/25/2020 03/28/2020 02/12/2020  ?WBC 3.4 - 10.8 x10E3/uL 7.4 6.8 -  ?Hemoglobin 13.0 - 17.7 g/dL 14.2 13.8 10.9(L)  ?Hematocrit 37.5 - 51.0 % 41.2 41.1 33.2(L)  ?Platelets 150 - 450 x10E3/uL 220 228.0 -  ? ? ?Lipid Panel  ?   ?Component Value Date/Time  ? CHOL 135 09/12/2020 0951  ? TRIG 227 (H) 09/12/2020 0951  ? HDL 33 (L) 09/12/2020 0951  ? CHOLHDL 4 03/28/2020 0915  ? VLDL 59.8 (H) 03/28/2020 0915  ? [place] 65 09/12/2020 0951  ? LDLDIRECT 80.0 03/28/2020 0915  ? ?HEMOGLOBIN A1C ?Lab Results  ?Component  Value Date  ? HGBA1C 5.6 03/28/2020  ? ?TSH ?No results for  input(s): TSH in the last 8760 hours.  ? ?Allergies  ? ?Allergies  ?Allergen Reactions  ? Micardis [Telmisartan] Swelling  ?  Tongue swelling  ?  ? ?Final Medications at End of Visit   ? ?Current Outpatient Medications:  ?  aspirin EC 81 MG tablet, Take 81 mg by mouth daily. Swallow whole., Disp: , Rfl:  ?  cetirizine (ZYRTEC) 10 MG tablet, Take 10 mg by mouth daily., Disp: , Rfl:  ?  Cholecalciferol (VITAMIN D3) 5000 units TABS, Take 5,000 Units by mouth daily., Disp: , Rfl:  ?  clopidogrel (PLAVIX) 75 MG tablet, TAKE 1 TABLET BY MOUTH EVERY DAY, Disp: 90 tablet, Rfl: 1 ?  Cyanocobalamin (B-12) 5000 MCG CAPS, Take 5,000 mcg by mouth daily., Disp: , Rfl:  ?  finasteride (PROSCAR) 5 MG tablet, Take 5 mg by mouth daily., Disp: , Rfl:  ?  icosapent Ethyl (VASCEPA) 1 g capsule, Take 2 capsules (2 g total) by mouth 2 (two) times daily., Disp: 120 capsule, Rfl: 3 ?  metoprolol succinate (TOPROL-XL) 100 MG 24 hr tablet, TAKE 1 TABLET BY MOUTH TWICE DAILY WITH OR IMMEDIATELY FOLLOWING A MEAL, Disp: 180 tablet, Rfl: 3 ?  olmesartan-hydrochlorothiazide (BENICAR HCT) 40-25 MG tablet, TAKE 1 TABLET BY MOUTH EVERY DAY IN THE MORNING, Disp: 90 tablet, Rfl: 3 ?  rosuvastatin (CRESTOR) 20 MG tablet, TAKE 1 TABLET BY MOUTH EVERY DAY, Disp: 90 tablet, Rfl: 3 ?  tetrahydrozoline 0.05 % ophthalmic solution, Place 1 drop into both eyes daily. Visine, Disp: , Rfl:  ?  vitamin C (ASCORBIC ACID) 500 MG tablet, Take 500 mg by mouth daily., Disp: , Rfl:   ?  ?Radiology:  ? ?Limited Ultrasound of the abdomen 11/06/2018: ?Gallbladder sludge and fatty liver and positive Murphy's sign and recommended HIDA scan. ? ?Cardiac Studies:  ? ?Lower Extremity Arterial Duplex 03/22/2019:  ?No hemodynamically significant stenosis are identified in the lower  extremity arterial system.  Left mid SFA balloon angioplasty site is patent.  ?This exam reveals normal perfusion of the right lower extremity (ABI 1.09)  and mildly decreased perfusion of the left  lower extremity, noted at the  ?anterior tibial artery level (ABI 0.87).  Compared to 08/01/2015,  no  significant change in ABI. ? ?Peripheral angiogram 05/02/2020: ?Abdominal angiogram reveals presence of

## 2021-04-02 ENCOUNTER — Ambulatory Visit (INDEPENDENT_AMBULATORY_CARE_PROVIDER_SITE_OTHER): Payer: Medicare HMO | Admitting: Internal Medicine

## 2021-04-02 ENCOUNTER — Other Ambulatory Visit: Payer: Self-pay

## 2021-04-02 ENCOUNTER — Encounter: Payer: Self-pay | Admitting: Internal Medicine

## 2021-04-02 VITALS — BP 112/70 | HR 60 | Temp 97.7°F | Ht 67.5 in | Wt 244.0 lb

## 2021-04-02 DIAGNOSIS — I739 Peripheral vascular disease, unspecified: Secondary | ICD-10-CM | POA: Diagnosis not present

## 2021-04-02 DIAGNOSIS — N401 Enlarged prostate with lower urinary tract symptoms: Secondary | ICD-10-CM

## 2021-04-02 DIAGNOSIS — Z0000 Encounter for general adult medical examination without abnormal findings: Secondary | ICD-10-CM

## 2021-04-02 DIAGNOSIS — C61 Malignant neoplasm of prostate: Secondary | ICD-10-CM | POA: Diagnosis not present

## 2021-04-02 DIAGNOSIS — I10 Essential (primary) hypertension: Secondary | ICD-10-CM | POA: Diagnosis not present

## 2021-04-02 DIAGNOSIS — Z7189 Other specified counseling: Secondary | ICD-10-CM | POA: Diagnosis not present

## 2021-04-02 DIAGNOSIS — I779 Disorder of arteries and arterioles, unspecified: Secondary | ICD-10-CM | POA: Diagnosis not present

## 2021-04-02 DIAGNOSIS — N138 Other obstructive and reflux uropathy: Secondary | ICD-10-CM

## 2021-04-02 NOTE — Assessment & Plan Note (Signed)
Prefers not to be resuscitated--but doesn't want to have DNR form on his fridge ?

## 2021-04-02 NOTE — Assessment & Plan Note (Signed)
5% of sample from TURP ?On observation only ?Will check PSA ?

## 2021-04-02 NOTE — Assessment & Plan Note (Signed)
Gets yearly checks ?No CVA symptoms ?On ASA, plavix, crestor, vascepa 2 bid ?

## 2021-04-02 NOTE — Progress Notes (Signed)
Vision Screening   Right eye Left eye Both eyes  Without correction 20/20 20/20 20/20  With correction     Hearing Screening - Comments:: Passed whisper test  

## 2021-04-02 NOTE — Assessment & Plan Note (Signed)
I have personally reviewed the Medicare Annual Wellness questionnaire and have noted ?1. The patient's medical and social history ?2. Their use of alcohol, tobacco or illicit drugs ?3. Their current medications and supplements ?4. The patient's functional ability including ADL's, fall risks, home safety risks and hearing or visual ?            impairment. ?5. Diet and physical activities ?6. Evidence for depression or mood disorders ? ?The patients weight, height, BMI and visual acuity have been recorded in the chart ?I have made referrals, counseling and provided education to the patient based review of the above and I have provided the pt with a written personalized care plan for preventive services. ? ?I have provided you with a copy of your personalized plan for preventive services. Please take the time to review along with your updated medication list. ? ?Done with colonoscopies ?Still prefers no flu, COVID, shingles vaccines ?Begged him to stop smoking ?[facility] ?
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
## 2021-04-02 NOTE — Assessment & Plan Note (Signed)
Voids okay after TURP ?Is on the finasteride ?

## 2021-04-02 NOTE — Assessment & Plan Note (Signed)
Ongoing claudication limits him ?On ASA 81, plavix 75, crestor '20mg'$  daily ?Urged him to stop the cigarettes ?

## 2021-04-02 NOTE — Assessment & Plan Note (Signed)
BP Readings from Last 3 Encounters:  ?04/02/21 112/70  ?03/27/21 120/66  ?08/23/20 117/74  ? ?Good control on olmesartan/hCTZ 40/25 and toprol 100 bid ?

## 2021-04-03 LAB — RENAL FUNCTION PANEL
Albumin: 4.3 g/dL (ref 3.5–5.2)
BUN: 24 mg/dL — ABNORMAL HIGH (ref 6–23)
CO2: 28 mEq/L (ref 19–32)
Calcium: 10.4 mg/dL (ref 8.4–10.5)
Chloride: 103 mEq/L (ref 96–112)
Creatinine, Ser: 1.14 mg/dL (ref 0.40–1.50)
GFR: 63.08 mL/min (ref >60.00)
Glucose, Bld: 95 mg/dL (ref 70–99)
Phosphorus: 3.7 mg/dL (ref 2.3–4.6)
Potassium: 4.2 mEq/L (ref 3.5–5.1)
Sodium: 139 mEq/L (ref 135–145)

## 2021-04-03 LAB — LIPID PANEL
Cholesterol: 194 mg/dL (ref 0–200)
HDL: 41.6 mg/dL (ref >39.00)
NonHDL: 152.32
Total CHOL/HDL Ratio: 5
Triglycerides: 344 mg/dL — ABNORMAL HIGH (ref 0.0–149.0)
VLDL: 68.8 mg/dL — ABNORMAL HIGH (ref 0.0–40.0)

## 2021-04-03 LAB — HEPATIC FUNCTION PANEL
ALT: 14 U/L (ref 0–53)
AST: 13 U/L (ref 0–37)
Albumin: 4.3 g/dL (ref 3.5–5.2)
Alkaline Phosphatase: 54 U/L (ref 39–117)
Bilirubin, Direct: 0.1 mg/dL (ref 0.0–0.3)
Total Bilirubin: 0.5 mg/dL (ref 0.2–1.2)
Total Protein: 6.9 g/dL (ref 6.0–8.3)

## 2021-04-03 LAB — CBC
HCT: 40.1 % (ref 39.0–52.0)
Hemoglobin: 13.8 g/dL (ref 13.0–17.0)
MCHC: 34.5 g/dL (ref 30.0–36.0)
MCV: 101.8 fl — ABNORMAL HIGH (ref 78.0–100.0)
Platelets: 219 10*3/uL (ref 150.0–400.0)
RBC: 3.94 Mil/uL — ABNORMAL LOW (ref 4.22–5.81)
RDW: 12.5 % (ref 11.5–15.5)
WBC: 7.1 10*3/uL (ref 4.0–10.5)

## 2021-04-03 LAB — PSA: PSA: 0.25 ng/mL (ref 0.10–4.00)

## 2021-04-03 LAB — LDL CHOLESTEROL, DIRECT: Direct LDL: 107 mg/dL

## 2021-04-06 IMAGING — DX DG CHEST 2V
2 series · 2 of 2 positions shown · non-contrast
Comparison: None

CLINICAL DATA: Chest pain

EXAM:
CHEST - 2 VIEW

[chest pa]
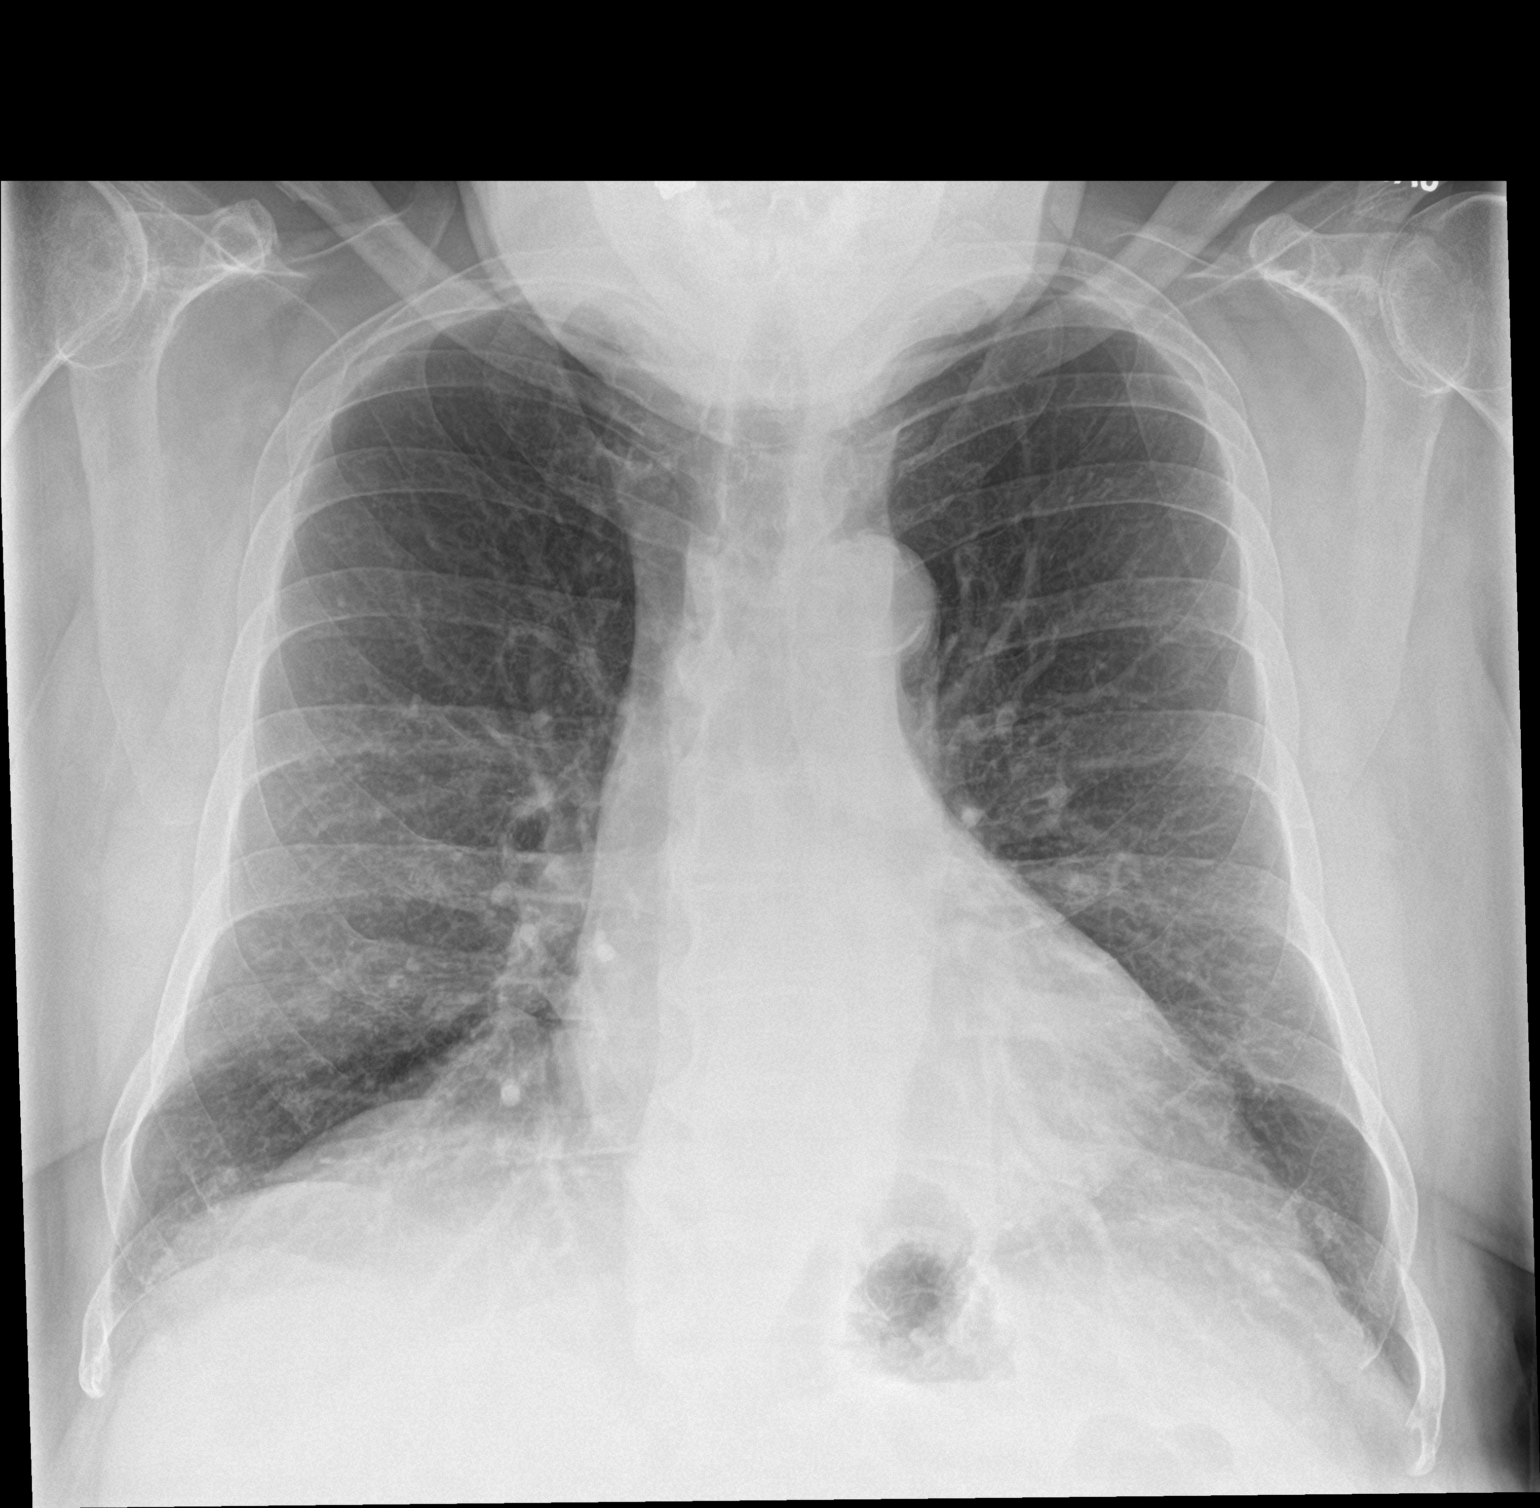

[chest lat]
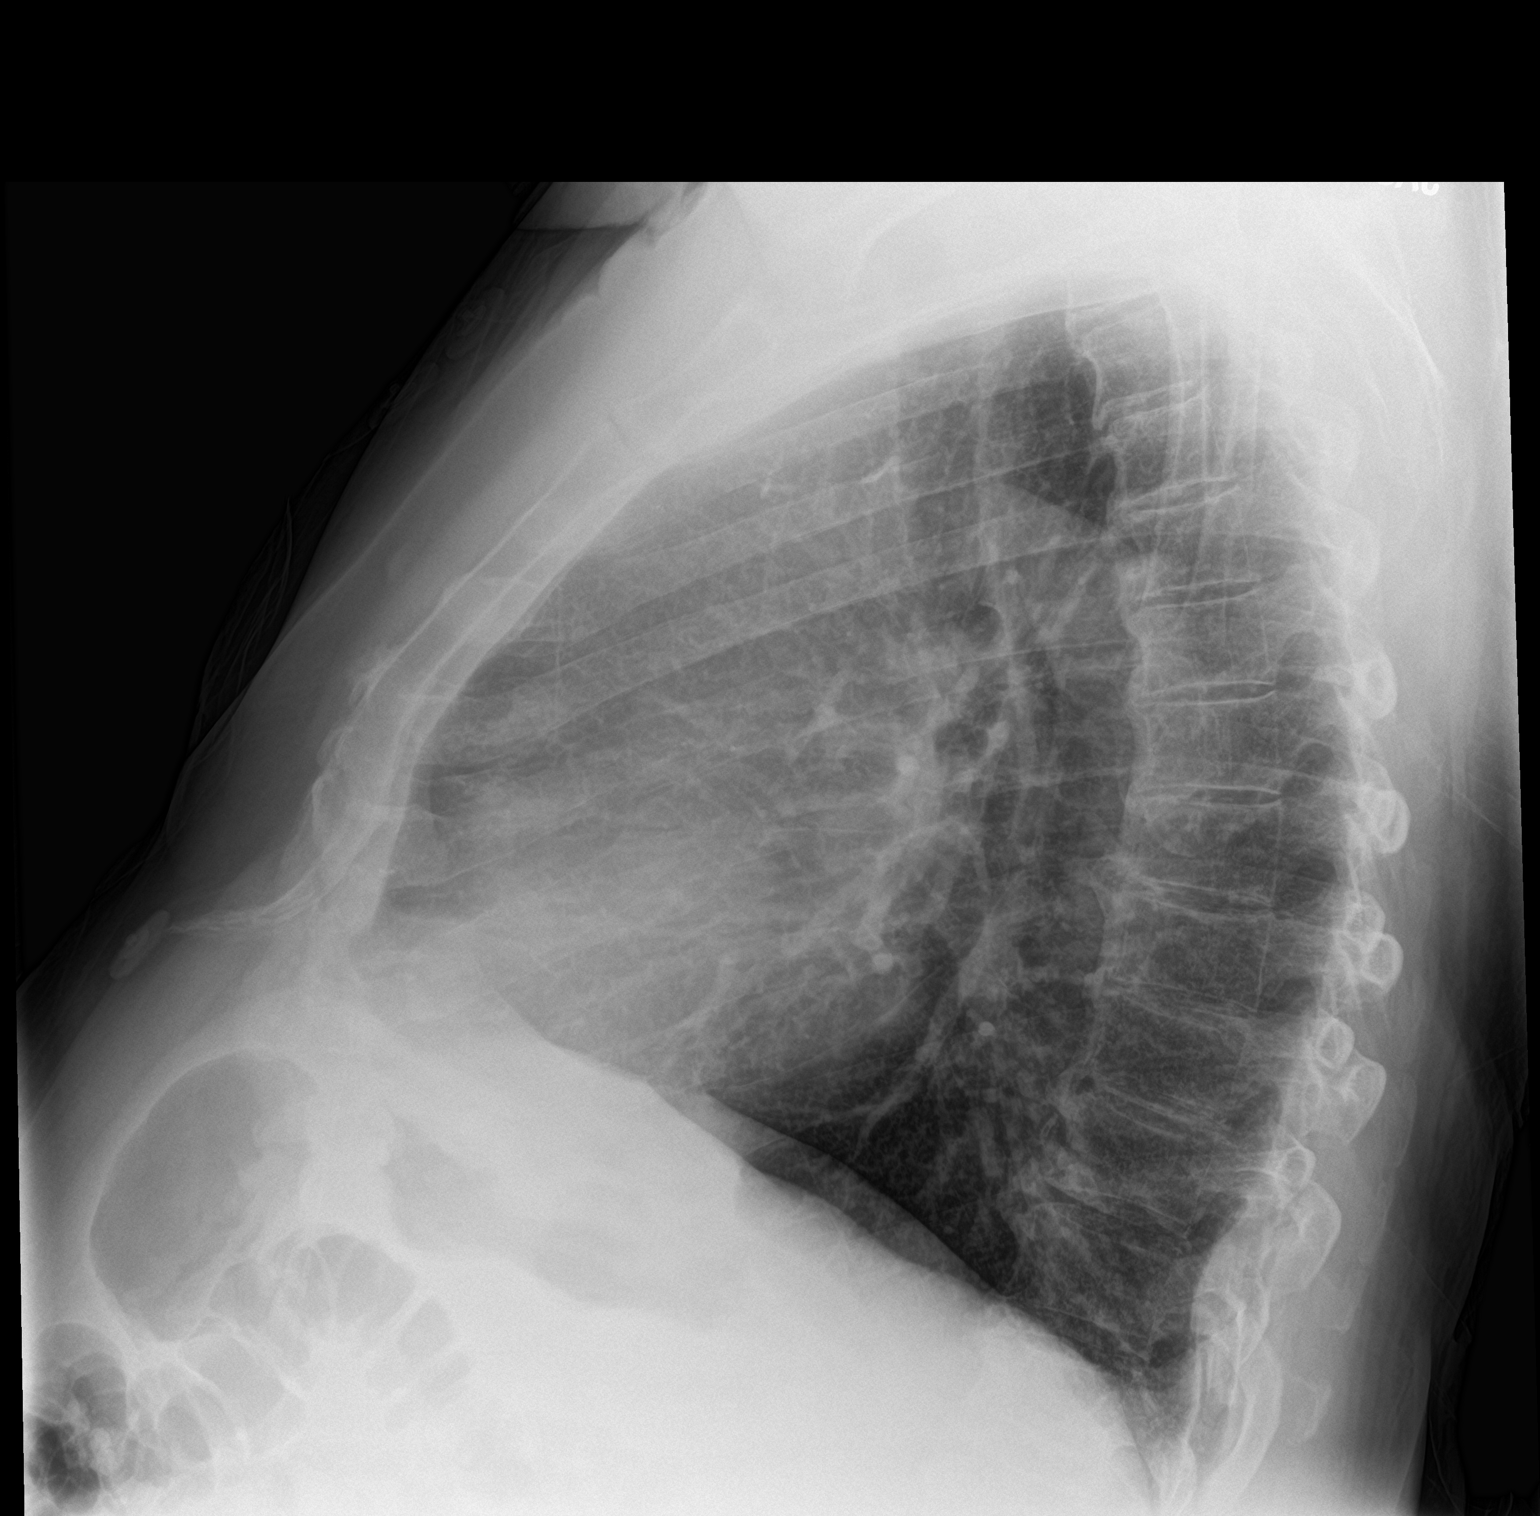

[2 of 2 positions shown; findings below may reference images not displayed]

FINDINGS: The heart size is mildly enlarged. Aortic calcifications are noted.
There is no pneumothorax or large pleural effusion. There is a
possible nodular density overlying the right lower lung zone
measuring approximately 1.6 cm. There is no acute osseous
abnormality.
IMPRESSION: 1. No acute cardiopulmonary process.
2. Subtle nodular density projecting over the right lower lung zone
as detailed above. A 4-6 week follow-up two-view chest x-ray is
recommended to confirm stability of this finding.

## 2021-04-07 IMAGING — US US ABDOMEN LIMITED
1 series · 14 of 25 positions shown · non-contrast
Comparison: None.

CLINICAL DATA: Epigastric pain for 1 day.

EXAM:
ULTRASOUND ABDOMEN LIMITED RIGHT UPPER QUADRANT

[Series 1: us abdomen limited · 14 of 44 slices shown]
[im 1/44]
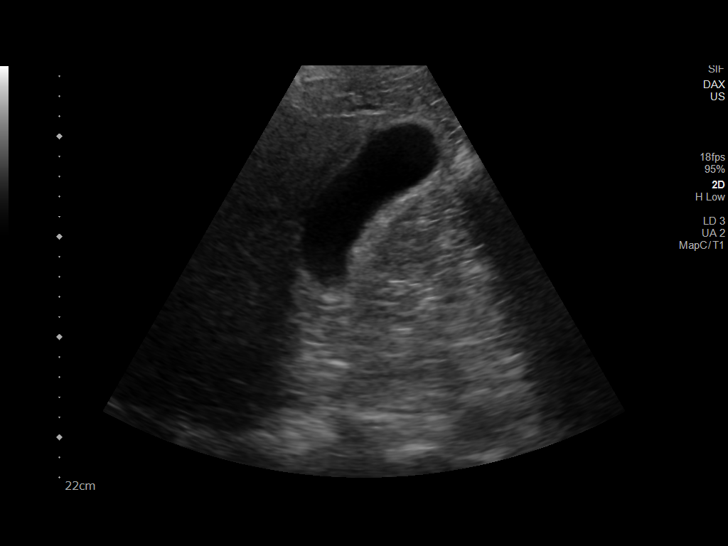
[im 4/44]
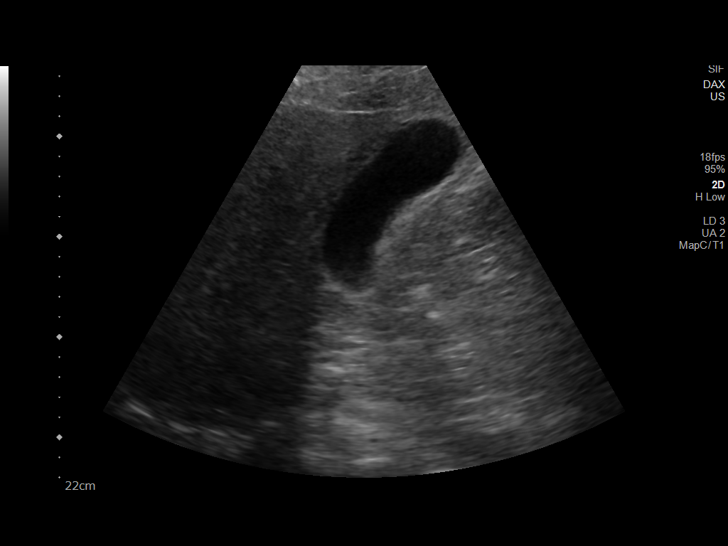
[im 8/44]
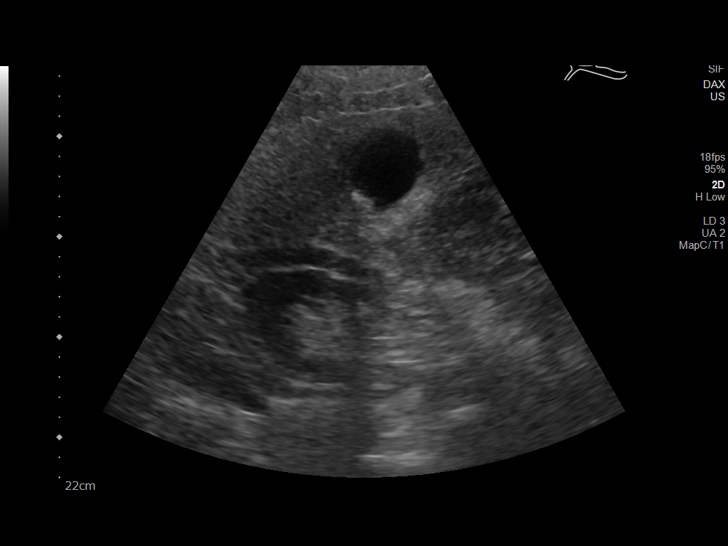
[im 11/44]
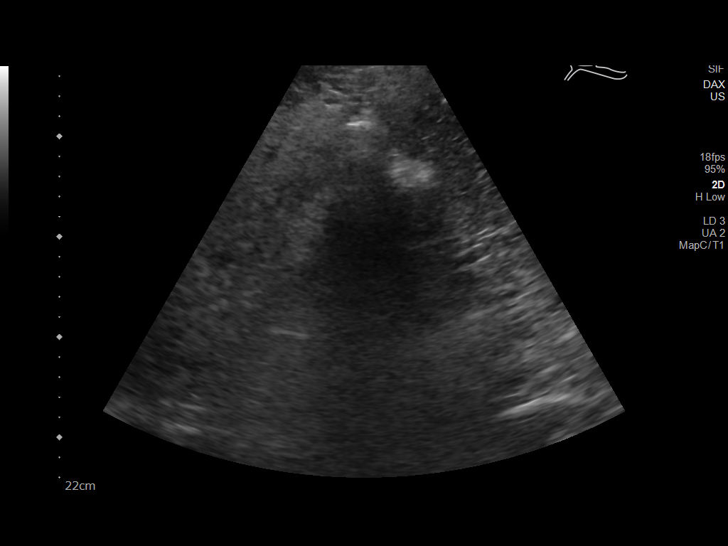
[im 15/44]
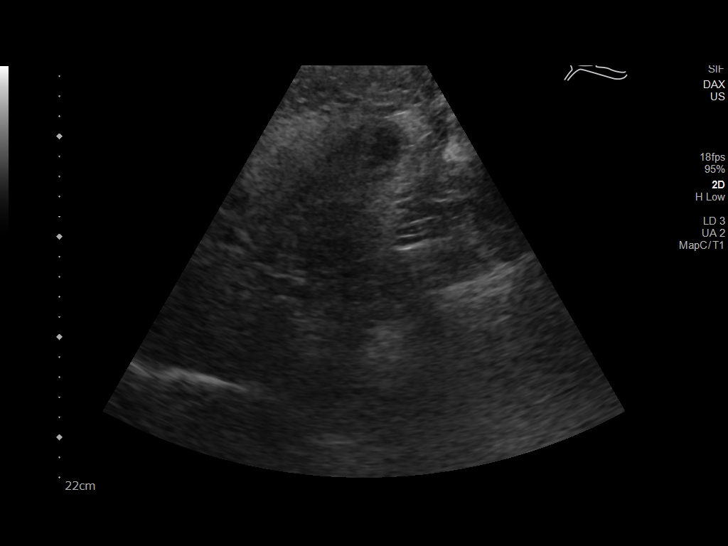
[im 17/44]
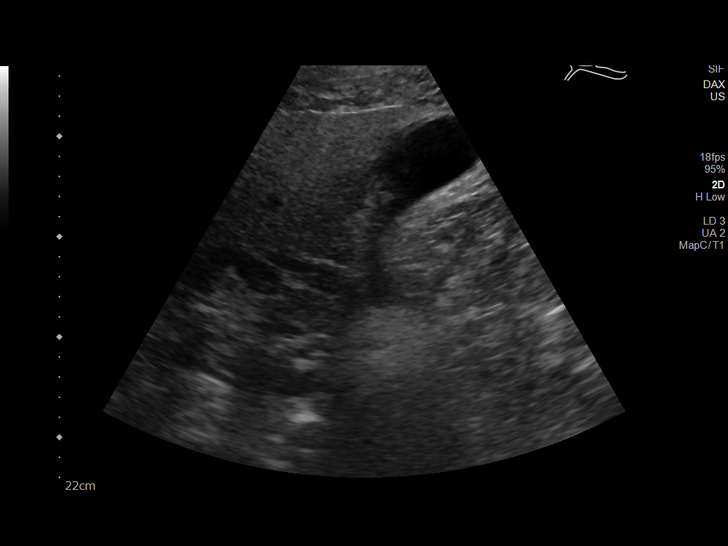
[im 20/44]
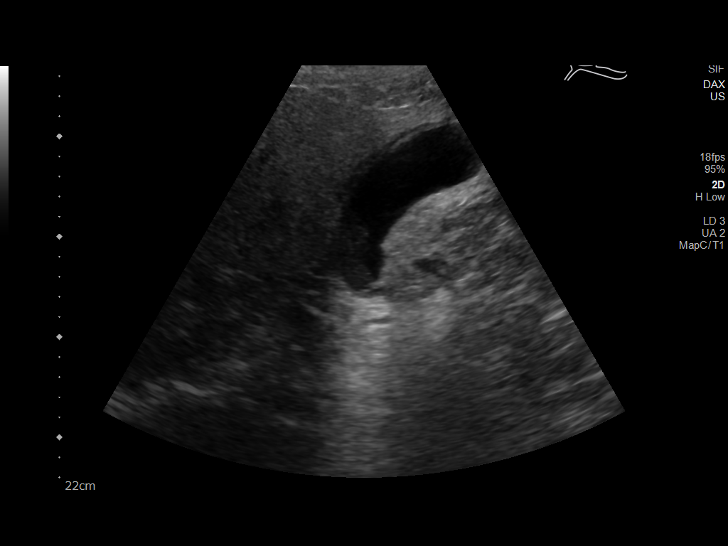
[im 24/44]
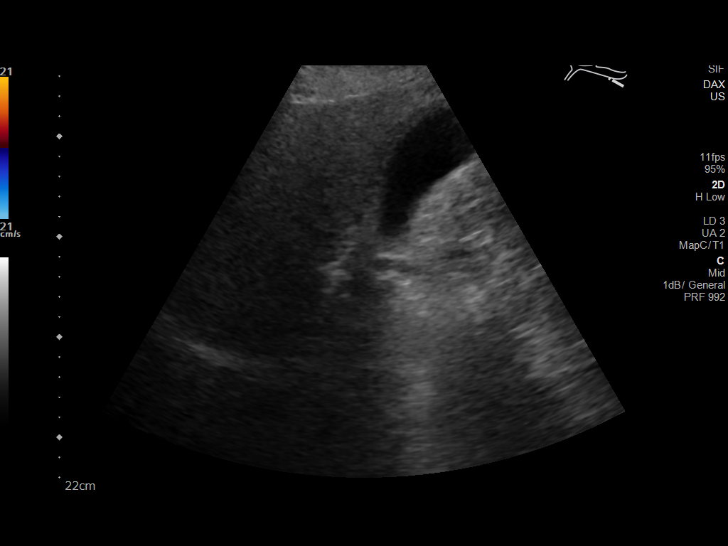
[im 27/44]
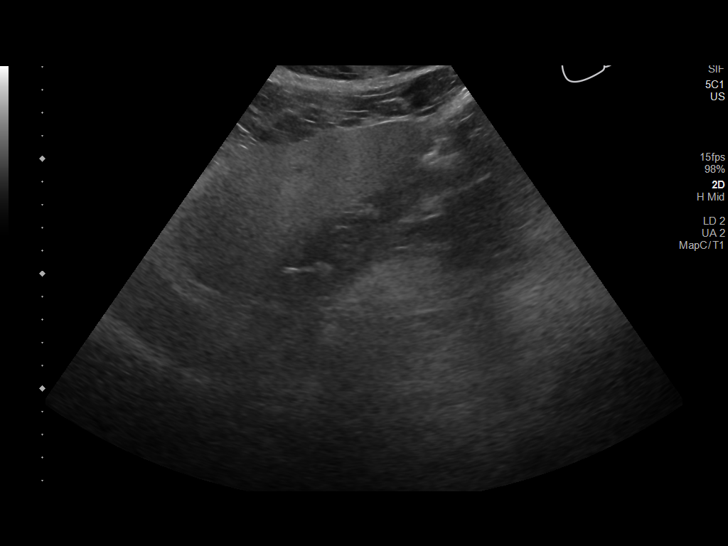
[im 29/44]
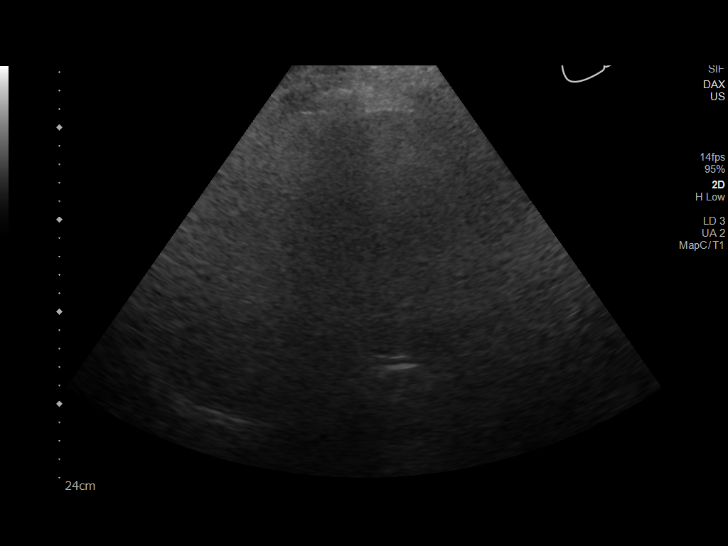
[im 33/44]
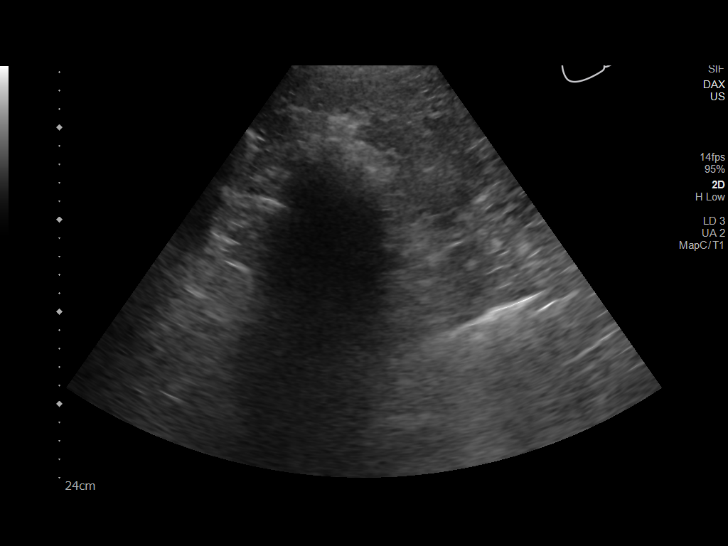
[im 36/44]
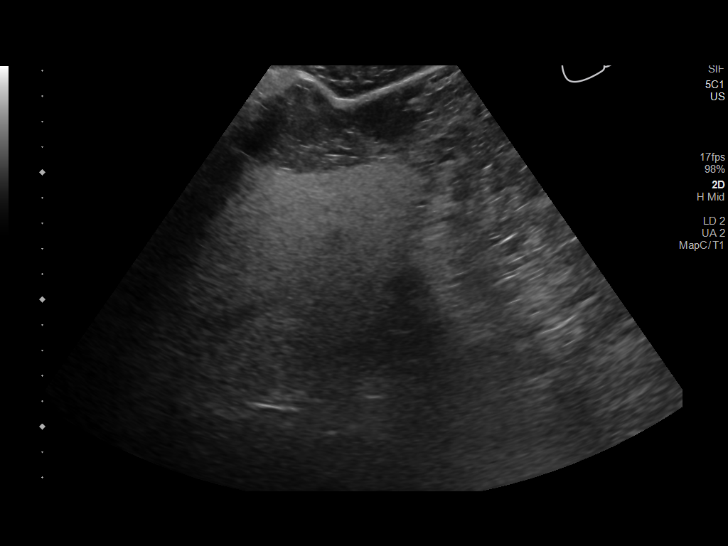
[im 40/44]
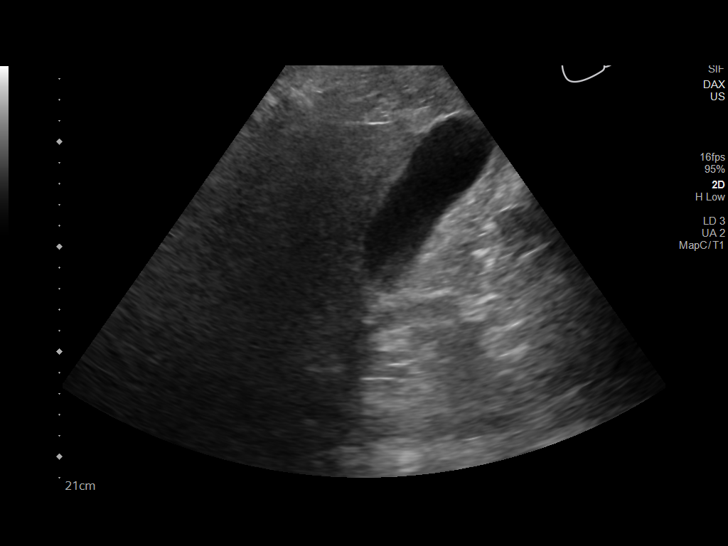
[im 44/44]
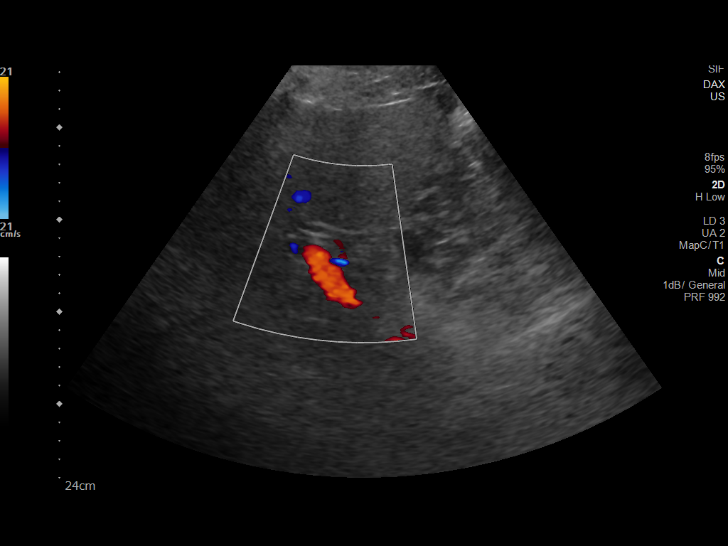

[14 of 25 positions shown; findings below may reference images not displayed]

FINDINGS: Gallbladder:

Borderline gallbladder wall thickening. Questionable gallbladder
wall edema. Probable sludge. No gallstones demonstrated. No
pericholecystic fluid.

Common bile duct:

Diameter: 5 mm

Liver:

Diffusely increased echogenicity of the liver indicating fatty
infiltration. No focal mass or lesion is identified within the
liver. Portal vein is patent on color Doppler imaging with normal
direction of blood flow towards the liver.

Other: None.
IMPRESSION: 1. Borderline gallbladder wall thickening and probable gallbladder
wall edema. Given the positive sonographic Murphy's sign elicited
during today's exam, early acute cholecystitis cannot be excluded.
Would consider nuclear medicine HIDA scan for confirmation.
2. Probable gallbladder sludge.  No gallstones identified.
3. No bile duct dilatation.
4. Fatty infiltration of the liver.

## 2021-04-09 ENCOUNTER — Encounter: Payer: Self-pay | Admitting: Cardiology

## 2021-04-09 ENCOUNTER — Telehealth: Payer: Self-pay | Admitting: Cardiology

## 2021-04-09 DIAGNOSIS — I739 Peripheral vascular disease, unspecified: Secondary | ICD-10-CM

## 2021-04-09 MED ORDER — EZETIMIBE 10 MG PO TABS: 10.0000 mg | ORAL_TABLET | Freq: Every day | ORAL | 3 refills | Status: DC

## 2021-04-09 NOTE — Telephone Encounter (Signed)
Lab Results  ?Component Value Date  ? CHOL 194 04/02/2021  ? HDL 41.60 04/02/2021  ? [place] 65 09/12/2020  ? LDLDIRECT 107.0 04/02/2021  ? TRIG 344.0 (H) 04/02/2021  ? CHOLHDL 5 04/02/2021  ?  ?Meds ordered this encounter  ?Medications  ? ezetimibe (ZETIA) 10 MG tablet  ?  Sig: Take 1 tablet (10 mg total) by mouth daily after supper.  ?  Dispense:  90 tablet  ?  Refill:  3  ?  ?

## 2021-05-22 ENCOUNTER — Encounter: Payer: Self-pay | Admitting: Family

## 2021-05-22 ENCOUNTER — Ambulatory Visit (INDEPENDENT_AMBULATORY_CARE_PROVIDER_SITE_OTHER): Payer: Medicare HMO | Admitting: Family

## 2021-05-22 VITALS — BP 128/66 | HR 74 | Temp 98.3°F | Resp 16 | Ht 67.5 in | Wt 246.2 lb

## 2021-05-22 DIAGNOSIS — L03316 Cellulitis of umbilicus: Secondary | ICD-10-CM | POA: Diagnosis not present

## 2021-05-22 DIAGNOSIS — S30860A Insect bite (nonvenomous) of lower back and pelvis, initial encounter: Secondary | ICD-10-CM | POA: Diagnosis not present

## 2021-05-22 DIAGNOSIS — D492 Neoplasm of unspecified behavior of bone, soft tissue, and skin: Secondary | ICD-10-CM | POA: Diagnosis not present

## 2021-05-22 DIAGNOSIS — Z1283 Encounter for screening for malignant neoplasm of skin: Secondary | ICD-10-CM

## 2021-05-22 DIAGNOSIS — W57XXXA Bitten or stung by nonvenomous insect and other nonvenomous arthropods, initial encounter: Secondary | ICD-10-CM | POA: Diagnosis not present

## 2021-05-22 MED ORDER — MUPIROCIN 2 % EX OINT: 1.0000 "application " | TOPICAL_OINTMENT | Freq: Two times a day (BID) | CUTANEOUS | 0 refills | Status: AC

## 2021-05-22 MED ORDER — DOXYCYCLINE HYCLATE 100 MG PO TABS: 100.0000 mg | ORAL_TABLET | Freq: Two times a day (BID) | ORAL | 0 refills | Status: AC

## 2021-05-22 NOTE — Assessment & Plan Note (Signed)
Monitor for worsening s/s infection ?Start antbx ?

## 2021-05-22 NOTE — Assessment & Plan Note (Signed)
Few small growths on back ?Suspected BCC ?

## 2021-05-22 NOTE — Assessment & Plan Note (Signed)
Referred to dermatology for annual skin screening and possible BCC ?

## 2021-05-22 NOTE — Patient Instructions (Signed)
A referral was placed today for dermatology. ?Please let us know if you have not heard back within 2 weeks about the referral. ? ?Apply cream to site in belly button twice daily with Qtip.  ? ?Due to recent changes in healthcare laws, you may see results of your imaging and/or laboratory studies on MyChart before I have had a chance to review them.  I understand that in some cases there may be results that are confusing or concerning to you. Please understand that not all results are received at the same time and often I may need to interpret multiple results in order to provide you with the best plan of care or course of treatment. Therefore, I ask that you please give me 2 business days to thoroughly review all your results before contacting my office for clarification. Should we see a critical lab result, you will be contacted sooner.  ? ?It was a pleasure seeing you today! Please do not hesitate to reach out with any questions and or concerns. ? ?Regards,  ? ?Kerrin Markman ?FNP-C ? ?

## 2021-05-22 NOTE — Assessment & Plan Note (Signed)
doxcycycline 100 mg po bid x 10 days for tick bite and slight cellulitis ?

## 2021-05-22 NOTE — Assessment & Plan Note (Signed)
Of belly button with irritation, rx doxycycline and rx mupirocin 2% apply to umbilical twice daily with Qtip ?
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
## 2021-05-30 ENCOUNTER — Other Ambulatory Visit: Payer: Self-pay | Admitting: Student

## 2021-06-12 DIAGNOSIS — R350 Frequency of micturition: Secondary | ICD-10-CM | POA: Diagnosis not present

## 2021-06-12 DIAGNOSIS — N281 Cyst of kidney, acquired: Secondary | ICD-10-CM | POA: Diagnosis not present

## 2021-06-12 DIAGNOSIS — C61 Malignant neoplasm of prostate: Secondary | ICD-10-CM | POA: Diagnosis not present

## 2021-06-12 DIAGNOSIS — I509 Heart failure, unspecified: Secondary | ICD-10-CM | POA: Diagnosis not present

## 2021-06-12 DIAGNOSIS — N200 Calculus of kidney: Secondary | ICD-10-CM | POA: Diagnosis not present

## 2021-07-26 ENCOUNTER — Ambulatory Visit: Payer: Medicare HMO

## 2021-07-26 DIAGNOSIS — I6523 Occlusion and stenosis of bilateral carotid arteries: Secondary | ICD-10-CM

## 2021-09-10 ENCOUNTER — Other Ambulatory Visit: Payer: Self-pay | Admitting: Internal Medicine

## 2021-10-29 ENCOUNTER — Ambulatory Visit: Payer: Medicare HMO | Admitting: Cardiology

## 2021-11-05 ENCOUNTER — Ambulatory Visit: Payer: Medicare HMO | Admitting: Cardiology

## 2021-11-05 ENCOUNTER — Encounter: Payer: Self-pay | Admitting: Cardiology

## 2021-11-05 VITALS — BP 114/70 | HR 80 | Temp 97.8°F | Resp 16 | Ht 67.5 in | Wt 234.4 lb

## 2021-11-05 DIAGNOSIS — R0609 Other forms of dyspnea: Secondary | ICD-10-CM | POA: Diagnosis not present

## 2021-11-05 DIAGNOSIS — F17200 Nicotine dependence, unspecified, uncomplicated: Secondary | ICD-10-CM

## 2021-11-05 DIAGNOSIS — E782 Mixed hyperlipidemia: Secondary | ICD-10-CM

## 2021-11-05 DIAGNOSIS — I10 Essential (primary) hypertension: Secondary | ICD-10-CM | POA: Diagnosis not present

## 2021-11-05 DIAGNOSIS — F17210 Nicotine dependence, cigarettes, uncomplicated: Secondary | ICD-10-CM | POA: Diagnosis not present

## 2021-11-05 DIAGNOSIS — I6523 Occlusion and stenosis of bilateral carotid arteries: Secondary | ICD-10-CM | POA: Diagnosis not present

## 2021-11-05 DIAGNOSIS — I739 Peripheral vascular disease, unspecified: Secondary | ICD-10-CM

## 2021-11-05 NOTE — Progress Notes (Signed)
Primary Physician/Referring:  [name], MD  Patient ID: Brian Clarke, male    DOB: [birth year], 76 y.o.   MRN: [id number]  Chief Complaint  Patient presents with   PAD   Hyperlipidemia   Follow-up    7 months   HPI:    Brian Clarke  is a 76 y.o.  Caucasian male patient with peripheral artery disease, peripheral neuropathy related to alcohol, tobacco use disorder, still smoking 1 pack of cigarettes a day, asymptomatic bilateral carotid artery stenosis, hyperlipidemia and PAD with PTA of left SFA on 09/07/2013. Repeat angiography on 05/02/2020 revealed patent angioplasty site below the left knee he had one vessel runoff and right leg showed mild diffuse disease and 2 vessel r/o.   Patient presents for 87-monthfollow-up.  Patient's symptoms of claudication and dyspnea on exertion are stable.  He also has severe degenerative joint disease involving his bilateral knee, he is bone-on-bone for this.  Hence his physical activity is less.  No rest pain, no bruise discoloration or open wounds.  Past Medical History:  Diagnosis Date   Anxiety    Arthritis    thumbs    Bilateral lower extremity edema    BPH (benign prostatic hypertrophy)    BPH with obstruction/lower urinary tract symptoms    Carotid stenosis, asymptomatic, bilateral    Claudication in peripheral vascular disease (H[facility])    DOE (dyspnea on exertion)    GERD (gastroesophageal reflux disease)    occasional   History of kidney stones    Hyperlipidemia    Hypertension    followed by pcp and cardiology   Nephrolithiasis    Peripheral neuropathy    related to alcohol and tobacco    Personal history of colonic polyps    [facility] [place]    09-07-2013  s/p  drug-luting balloon angioplasy Left SFA and  right SFA- mild to moderate disease   Renal cyst urologist--- dr mTresa Moore  bilateral non-complex renal cysts, chronic    Social History   Tobacco Use   Smoking status: Every Day    Packs/day: 1.00     Years: 58.00    Total pack years: 58.00    Types: Cigarettes    Last attempt to quit: 01/07/2017    Years since quitting: 4.8    Passive exposure: Never   Smokeless tobacco: Never  Substance Use Topics   Alcohol use: Yes    Alcohol/week: 14.0 standard drinks of alcohol    Types: 14 Standard drinks or equivalent per week   Marital status: Married   ROS  Review of Systems  Cardiovascular:  Positive for claudication and leg swelling. Negative for chest pain and dyspnea on exertion.   Objective      11/05/2021    9:48 AM 05/22/2021    8:49 AM 04/02/2021    3:13 PM  Vitals with BMI  Height 5' 7.5" 5' 7.5" 5' 7.5"  Weight 234 lbs 6 oz 246 lbs 4 oz 244 lbs  BMI 36.15 [phone] Systolic [id number] Diastolic 70 66 70  Pulse 80 74 60    Blood pressure 114/70, pulse 80, temperature 97.8 F (36.6 C), temperature source Temporal, resp. rate 16, height 5' 7.5" (1.715 m), weight 234 lb 6.4 oz (106.3 kg), SpO2 96 %. Body mass index is 36.17 kg/m.   Physical Exam Constitutional:      Appearance: He is obese.  Neck:     Vascular: Carotid bruit (right) present.  No JVD.  Cardiovascular:     Rate and Rhythm: Normal rate and regular rhythm.     Pulses:          Radial pulses are 2+ on the right side and 2+ on the left side.       Femoral pulses are 2+ on the right side and 2+ on the left side.      Popliteal pulses are 1+ on the right side and 1+ on the left side.       Dorsalis pedis pulses are 0 on the right side and 0 on the left side.       Posterior tibial pulses are 1+ on the right side and 1+ on the left side.     Heart sounds: Normal heart sounds. No murmur heard.    No gallop.  Pulmonary:     Effort: Pulmonary effort is normal.     Breath sounds: Normal breath sounds.  Abdominal:     General: Bowel sounds are normal.     Palpations: Abdomen is soft.  Musculoskeletal:     Right lower leg: Edema (1+ pitting) present.     Left lower leg: Edema (1+pitting) present.     Laboratory examination:   Lab Results  Component Value Date   NA 139 04/02/2021   K 4.2 04/02/2021   CO2 28 04/02/2021   GLUCOSE 95 04/02/2021   BUN 24 (H) 04/02/2021   CREATININE 1.14 04/02/2021   CALCIUM 10.4 04/02/2021   EGFR 69 04/25/2020   GFRNONAA >60 02/12/2020       Latest Ref Rng & Units 04/02/2021    4:22 PM 04/25/2020   10:17 AM 03/28/2020    9:15 AM  CMP  Glucose 70 - 99 mg/dL 95  98  110   BUN 6 - 23 mg/dL _0 Creatinine 0.40 - 1.50 mg/dL 1.14  1.12  1.11   Sodium 135 - 145 mEq/L 139  138  141   Potassium 3.5 - 5.1 mEq/L 4.2  4.6  4.5   Chloride 96 - 112 mEq/L 103  103  106   CO2 19 - 32 mEq/L _1 Calcium 8.4 - 10.5 mg/dL 10.4  10.7  10.7   Total Protein 6.0 - 8.3 g/dL 6.9   7.0   Total Bilirubin 0.2 - 1.2 mg/dL 0.5   0.4   Alkaline Phos 39 - 117 U/L 54   70   AST 0 - 37 U/L 13   20   ALT 0 - 53 U/L 14   24       Latest Ref Rng & Units 04/02/2021    4:22 PM 04/25/2020   10:17 AM 03/28/2020    9:15 AM  CBC  WBC 4.0 - 10.5 K/uL 7.1  7.4  6.8   Hemoglobin 13.0 - 17.0 g/dL 13.8  14.2  13.8   Hematocrit 39.0 - 52.0 % 40.1  41.2  41.1   Platelets 150.0 - 400.0 K/uL 219.0  220  228.0    Lab Results  Component Value Date   CHOL 194 04/02/2021   HDL 41.60 04/02/2021   LDLCALC 65 09/12/2020   LDLDIRECT 107.0 04/02/2021   TRIG 344.0 (H) 04/02/2021   CHOLHDL 5 04/02/2021     HEMOGLOBIN A1C Lab Results  Component Value Date   HGBA1C 5.6 03/28/2020   No results found for: "TSH"   Allergies   Allergies  Allergen Reactions   Micardis [  Telmisartan] Swelling    Tongue swelling     Final Medications at End of Visit    Current Outpatient Medications:    cetirizine (ZYRTEC) 10 MG tablet, Take 10 mg by mouth daily., Disp: , Rfl:    Cholecalciferol (VITAMIN D3) 5000 units TABS, Take 5,000 Units by mouth daily., Disp: , Rfl:    clopidogrel (PLAVIX) 75 MG tablet, TAKE 1 TABLET BY MOUTH EVERY DAY, Disp: 90 tablet, Rfl: 1    Cyanocobalamin (B-12) 5000 MCG CAPS, Take 5,000 mcg by mouth daily., Disp: , Rfl:    ezetimibe (ZETIA) 10 MG tablet, Take 1 tablet (10 mg total) by mouth daily after supper., Disp: 90 tablet, Rfl: 3   finasteride (PROSCAR) 5 MG tablet, Take 5 mg by mouth daily., Disp: , Rfl:    metoprolol succinate (TOPROL-XL) 100 MG 24 hr tablet, TAKE 1 TABLET BY MOUTH TWICE DAILY WITH OR IMMEDIATELY FOLLOWING A MEAL, Disp: 180 tablet, Rfl: 2   olmesartan-hydrochlorothiazide (BENICAR HCT) 40-25 MG tablet, TAKE 1 TABLET BY MOUTH EVERY DAY IN THE MORNING, Disp: 90 tablet, Rfl: 3   rosuvastatin (CRESTOR) 20 MG tablet, TAKE 1 TABLET BY MOUTH EVERY DAY, Disp: 90 tablet, Rfl: 3   tetrahydrozoline 0.05 % ophthalmic solution, Place 1 drop into both eyes daily. Visine, Disp: , Rfl:    VASCEPA 1 g capsule, TAKE 2 CAPSULES BY MOUTH 2 TIMES DAILY., Disp: 120 capsule, Rfl: 3   vitamin C (ASCORBIC ACID) 500 MG tablet, Take 500 mg by mouth daily., Disp: , Rfl:     Radiology:   Limited Ultrasound of the abdomen 11/06/2018: Gallbladder sludge and fatty liver and positive Murphy's sign and recommended HIDA scan.  Cardiac Studies:   Lower Extremity Arterial Duplex 03/22/2019:  No hemodynamically significant stenosis are identified in the lower  extremity arterial system.  Left mid SFA balloon angioplasty site is patent.  This exam reveals normal perfusion of the right lower extremity (ABI 1.09)  and mildly decreased perfusion of the left lower extremity, noted at the  anterior tibial artery level (ABI 0.87).  Compared to 08/01/2015,  no  significant change in ABI.  Peripheral angiogram 05/02/2020: Abdominal angiogram reveals presence of 2 renal arteries on the left with mild ostial disease of the superior pole of 30%.  Single renal artery on the right with a 40% proximal stenosis with mild calcification.  Aortoiliac bifurcation is widely patent.  Mild atherosclerotic changes noted in the abdominal aorta. Left femoral arteriogram  distal runoff: The left common iliac, external and internal iliac arteries are widely patent with mild disease.  The left SFA has mild diffuse disease of 30% with mild calcification.  Below the left knee, there is single-vessel runoff in the form of peroneal artery which appears to be dominant and gives collaterals to the posterior tibial artery at the level of the ankle. Prior PTA site in SFA from 2017 [facility] angioplasty site patent. NO change in below knee vasculature.  The small vessel disease involving the collateralized segment of the PT.  Lesion is mildly progressed since 2015. Right common and external iliac artery and internal iliac artery widely patent with mild disease.  Right SFA in the distal segment has a 40% stenosis which is calcific.  Brisk flow was noted all the way up to the ankle on the right.  There is two-vessel runoff, right anterior tibial is occluded. Impression: Symptoms of claudication are probably related to combination of small vessel disease which in the absence of limb threatening ischemia medical  therapy advised and probably venous insufficiency and also severe degenerative joint disease, patient was bone-on-bone at the hip and moderate DJD involving bilateral knee as well.  85 mill contrast utilized.  Medical therapy.  PCV ECHOCARDIOGRAM COMPLETE 10/27/2020  Narrative Echocardiogram 10/27/2020: Normal LV systolic function with visual EF 55-60%. Left ventricle cavity is normal in size. Moderate left ventricular hypertrophy. Normal global wall motion. Unable to evaluate diastolic function due to mitral annular calcification. Elevated LAP. Trace aortic regurgitation. Native mitral valve, annular calcification, mild (Grade I) mitral regurgitation. Mild tricuspid regurgitation. No evidence of pulmonary hypertension. RVSP measures 35 mmHg. IVC is dilated with a respiratory response of >50%. Compared to study 10/08/2016 no significant change.  Carotid artery duplex  07/26/2021: Duplex suggests stenosis in the right internal carotid artery (50-69%). Duplex suggests stenosis in the left internal carotid artery (16-49%). Duplex suggests stenosis in the left common carotid artery (<50%). Duplex suggests stenosis in the left external carotid artery (<50%). Antegrade right vertebral artery flow. Antegrade left vertebral artery flow. Compared to the study done on 10/27/2020, no change in the right ICA stenosis, left ICA stenosis has improved from greater than 50%.  There is diffuse disease in the left range of <50%. Follow up in six months is appropriate if clinically indicated.    EKG:    EKG 11/05/2021: Normal sinus rhythm at rate of 64 bpm, left atrial enlargement, otherwise normal EKG.  Compared to 03/27/2021, no change.  Assessment     ICD-10-CM   1. Bilateral carotid artery stenosis  I65.23 EKG 12-Lead    PCV CAROTID DUPLEX (BILATERAL)    CANCELED: PCV CAROTID DUPLEX (BILATERAL)    2. PAD (peripheral artery disease) (HCC)  I73.9 Lipoprotein A (LPA)    3. Essential hypertension, benign  I10     4. Mixed hyperlipidemia  E78.2 Lipid Panel With LDL/HDL Ratio    LDL cholesterol, direct    5. Tobacco use disorder  F17.200 CT CHEST LUNG CA SCREEN LOW DOSE W/O CM    6. Dyspnea on exertion  R06.09 CT CHEST LUNG CA SCREEN LOW DOSE W/O CM      No orders of the defined types were placed in this encounter.  Medications Discontinued During This Encounter  Medication Reason   aspirin EC 81 MG tablet Discontinued by provider     Recommendations:   Brian Clarke  is a 76 y.o. Caucasian male patient with peripheral artery disease, peripheral neuropathy related to alcohol, tobacco use disorder, still smoking 1 pack of cigarettes a day, asymptomatic bilateral carotid artery stenosis, hyperlipidemia and PAD with PTA of left SFA on 09/07/2013. Repeat angiography on 05/02/2020 revealed patent angioplasty site below the left knee he had one vessel runoff and right  leg showed mild diffuse disease and 2 vessel r/o.   Patient presents for 84-monthfollow-up.  Patient's symptoms of claudication and dyspnea on exertion are stable.  No change in vascular exam, he does have faint right carotid bruit, absent pedal pulses but no evidence of critical limb ischemia.  He will need continued surveillance of his carotid artery stenosis.  I will discontinue aspirin, continue Plavix indefinitely.  No clinical evidence of heart failure.  Lipids are at goal except for elevated triglycerides, however he has lost about 12 to 14 pounds in weight over the past 6 months as he has been careful with his diet, he is also reduced alcohol intake.  We will repeat lipid profile testing. Patient is presently on Vascepa.    In view  of >50-60-pack-year history of smoking, will obtain low-dose lung cancer screening CT.  Follow-up in 6 months, sooner if needed, for PAD,  hyperlipidemia.   [name], PA-C 11/05/2021, 10:25 AM Office: [phone]

## 2021-11-15 ENCOUNTER — Ambulatory Visit
Admission: RE | Admit: 2021-11-15 | Discharge: 2021-11-15 | Disposition: A | Discharge status: Home / Self Care | Payer: Medicare HMO | Source: Ambulatory Visit | Attending: Cardiology | Admitting: Cardiology

## 2021-11-15 DIAGNOSIS — F17200 Nicotine dependence, unspecified, uncomplicated: Secondary | ICD-10-CM

## 2021-11-15 DIAGNOSIS — F17210 Nicotine dependence, cigarettes, uncomplicated: Secondary | ICD-10-CM | POA: Diagnosis not present

## 2021-11-15 DIAGNOSIS — R0609 Other forms of dyspnea: Secondary | ICD-10-CM

## 2021-11-19 NOTE — Progress Notes (Signed)
He has abnormal renal mass, may need MRI of the abdomen.  Please let me know if he can follow this up.

## 2021-11-19 NOTE — Progress Notes (Signed)
Low-dose CT scan chest for lung cancer screening 11/19/2021: 1. Lung-RADS 2S, benign appearance or behavior. Continue annual screening with low-dose chest CT without contrast in 12 months.  Small pulmonary nodules noted in the lungs.  Largest in the right upper lobe is a groundglass attenuation nodule with mean diameter of 6.5 mm. 2. Aortic atherosclerosis, in addition to left main and three-vessel coronary artery disease.  Mitral annular calcification. 3. Mild diffuse bronchial wall thickening with mild centrilobular and paraseptal emphysema; imaging findings suggestive of underlying COPD. 4. Multiple renal lesions bilaterally, including a hyperdense lesion in the upper pole of the right kidney. Follow-up non-emergent abdominal MRI with and without IV gadolinium is recommended in the near future to provide definitive characterization and exclude neoplasm.  6 mm nonobstructive calculus in the right upper pole collecting system.

## 2021-11-20 ENCOUNTER — Telehealth: Payer: Self-pay

## 2021-11-20 DIAGNOSIS — N2889 Other specified disorders of kidney and ureter: Secondary | ICD-10-CM

## 2021-11-20 NOTE — Telephone Encounter (Signed)
-----   Message from [name], MD sent at 11/20/2021  7:47 AM EST ----- Please confirm he is okay with going ahead with MRI to look at thing in his kidney. Find out where is best if he agrees ----- Message ----- From: [name], MD Sent: 11/19/2021   6:10 PM EST To: [name], MD  He has abnormal renal mass, may need MRI of the abdomen.  Please let me know if he can follow this up.

## 2021-11-20 NOTE — Telephone Encounter (Signed)
Spoke to pt's wife per DPR. She said he does not use his MyChart account so he had not seen these results. I deactivated his MyChart account. She will talk to him about the results and see if he wants to move forward with the MRI.

## 2021-11-21 ENCOUNTER — Telehealth: Payer: Self-pay | Admitting: Internal Medicine

## 2021-11-21 NOTE — Telephone Encounter (Signed)
Patient wife called and said it is okay if an MRI is set up for late evenings or early mornings and as of well the weekends if possible. Call back number [phone].

## 2021-11-21 NOTE — Telephone Encounter (Signed)
Spoke with pt and he is agreeable to getting an MRI done.

## 2021-11-22 NOTE — Addendum Note (Signed)
Addended by: Viviana Simpler I on: 11/22/2021 07:46 AM   Modules accepted: Orders

## 2021-11-22 NOTE — Telephone Encounter (Signed)
Message sent to Ashtyn about this

## 2021-11-26 ENCOUNTER — Ambulatory Visit: Payer: Medicare HMO | Admitting: Dermatology

## 2021-12-03 NOTE — Telephone Encounter (Addendum)
[facility] will call patient to schedule this scan - they have screening questions to review prior to scheduling.   I called and made wife Rod Holler aware to expect a call to schedule the MRI or they can call to schedule. I gave the patient's Wife Rod Holler the number to call to schedule.   Working on [employer] - see referral notes for updates.

## 2021-12-21 ENCOUNTER — Ambulatory Visit
Admission: RE | Admit: 2021-12-21 | Discharge: 2021-12-21 | Disposition: A | Discharge status: Home / Self Care | Payer: Medicare HMO | Source: Ambulatory Visit | Attending: Internal Medicine | Admitting: Internal Medicine

## 2021-12-21 DIAGNOSIS — N2889 Other specified disorders of kidney and ureter: Secondary | ICD-10-CM | POA: Diagnosis not present

## 2021-12-21 MED ORDER — GADOPICLENOL 0.5 MMOL/ML IV SOLN
10.0000 mL | Freq: Once | INTRAVENOUS | Status: AC | PRN
  Administered 2021-12-21: 10 mL via INTRAVENOUS

## 2021-12-27 ENCOUNTER — Other Ambulatory Visit: Payer: Self-pay | Admitting: Cardiology

## 2021-12-27 DIAGNOSIS — E7800 Pure hypercholesterolemia, unspecified: Secondary | ICD-10-CM

## 2022-01-02 ENCOUNTER — Other Ambulatory Visit: Payer: Self-pay | Admitting: Cardiology

## 2022-01-02 DIAGNOSIS — I10 Essential (primary) hypertension: Secondary | ICD-10-CM

## 2022-04-04 ENCOUNTER — Encounter: Payer: Medicare HMO | Admitting: Internal Medicine

## 2022-04-14 ENCOUNTER — Other Ambulatory Visit: Payer: Self-pay | Admitting: Cardiology

## 2022-04-14 DIAGNOSIS — I739 Peripheral vascular disease, unspecified: Secondary | ICD-10-CM

## 2022-04-22 ENCOUNTER — Ambulatory Visit: Payer: Medicare HMO

## 2022-04-22 DIAGNOSIS — I6523 Occlusion and stenosis of bilateral carotid arteries: Secondary | ICD-10-CM | POA: Diagnosis not present

## 2022-04-30 NOTE — Progress Notes (Signed)
Carotid artery duplex 04/22/2022: Duplex suggests stenosis in the right internal carotid artery (1-15%). < 50% stenosis in the right common carotid artery. < 50% stenosis in the right external carotid artery. Duplex suggests stenosis in the left internal carotid artery (1-15%). < 50% stenosis in the left common carotid artery. < 50% stenosis in the left external carotid artery. Antegrade right vertebral artery flow. Antegrade left vertebral artery flow. Compared to the study done on 08/03/2021, there is further regression of stenosis severity in the bilateral ICA.  Follow-up study in 1 year to confirm regression.  Previously right ICA 50 to 69% and left ICA 16 to 49% stenosis.  Will discuss on OV soon

## 2022-05-05 NOTE — Progress Notes (Unsigned)
Primary Physician/Referring:  [name], MD  Patient ID: Brian Clarke, male    DOB: February 10, 1945, 77 y.o.   MRN: [id number]  No chief complaint on file.  HPI:    Brian Clarke  is a 77 y.o.  Caucasian male patient with peripheral artery disease, PTA of left SFA on 09/07/2013. Repeat angiography on 05/02/2020 revealed patent angioplasty site below the left knee he had one vessel runoff and right leg showed mild diffuse disease and 2 vessel r/o. Peripheral neuropathy related to alcohol, tobacco use disorder, still smoking 1 pack of cigarettes a day, asymptomatic bilateral carotid artery stenosis, hyperlipidemia.  Patient presents for 72-month follow-up. *** Patient's symptoms of claudication and dyspnea on exertion are stable.  He also has severe degenerative joint disease involving his bilateral knee, he is bone-on-bone for this.  Hence his physical activity is less.  No rest pain, no bruise discoloration or open wounds.  Past Medical History:  Diagnosis Date   Anxiety    Arthritis    thumbs    Bilateral lower extremity edema    BPH (benign prostatic hypertrophy)    BPH with obstruction/lower urinary tract symptoms    Carotid stenosis, asymptomatic, bilateral    Claudication in peripheral vascular disease (HCC)    DOE (dyspnea on exertion)    GERD (gastroesophageal reflux disease)    occasional   History of kidney stones    Hyperlipidemia    Hypertension    followed by pcp and cardiology   Nephrolithiasis    Peripheral neuropathy    related to alcohol and tobacco    Personal history of colonic polyps    PVD (peripheral vascular disease) (HCC)    09-07-2013  s/p  drug-luting balloon angioplasy Left SFA and  right SFA- mild to moderate disease   Renal cyst urologist--- dr Berneice Heinrich   bilateral non-complex renal cysts, chronic    Social History   Tobacco Use   Smoking status: Every Day    Packs/day: 1.00    Years: 58.00    Additional pack years: 0.00    Total pack years:  58.00    Types: Cigarettes    Last attempt to quit: 01/07/2017    Years since quitting: 5.3    Passive exposure: Never   Smokeless tobacco: Never  Substance Use Topics   Alcohol use: Yes    Alcohol/week: 14.0 standard drinks of alcohol    Types: 14 Standard drinks or equivalent per week   Marital status: Married   ROS  Review of Systems  Cardiovascular:  Positive for claudication and leg swelling. Negative for chest pain and dyspnea on exertion.   Objective      11/05/2021    9:48 AM 05/22/2021    8:49 AM 04/02/2021    3:13 PM  Vitals with BMI  Height 5' 7.5" 5' 7.5" 5' 7.5"  Weight 234 lbs 6 oz 246 lbs 4 oz 244 lbs  BMI 36.15 37.98 37.63  Systolic 114 128 409  Diastolic 70 66 70  Pulse 80 74 60    There were no vitals taken for this visit. There is no height or weight on file to calculate BMI.   Physical Exam Constitutional:      Appearance: He is obese.  Neck:     Vascular: Carotid bruit (right) present. No JVD.  Cardiovascular:     Rate and Rhythm: Normal rate and regular rhythm.     Pulses:          Radial  pulses are 2+ on the right side and 2+ on the left side.       Femoral pulses are 2+ on the right side and 2+ on the left side.      Popliteal pulses are 1+ on the right side and 1+ on the left side.       Dorsalis pedis pulses are 0 on the right side and 0 on the left side.       Posterior tibial pulses are 1+ on the right side and 1+ on the left side.     Heart sounds: Normal heart sounds. No murmur heard.    No gallop.  Pulmonary:     Effort: Pulmonary effort is normal.     Breath sounds: Normal breath sounds.  Abdominal:     General: Bowel sounds are normal.     Palpations: Abdomen is soft.  Musculoskeletal:     Right lower leg: Edema (1+ pitting) present.     Left lower leg: Edema (1+pitting) present.    Laboratory examination:   Lab Results  Component Value Date   NA 139 04/02/2021   K 4.2 04/02/2021   CO2 28 04/02/2021   GLUCOSE 95 04/02/2021    BUN 24 (H) 04/02/2021   CREATININE 1.14 04/02/2021   CALCIUM 10.4 04/02/2021   EGFR 69 04/25/2020   GFRNONAA >60 02/12/2020       Latest Ref Rng & Units 04/02/2021    4:22 PM 04/25/2020   10:17 AM 03/28/2020    9:15 AM  CMP  Glucose 70 - 99 mg/dL 95  98  914   BUN 6 - 23 mg/dL 24  24  28    Creatinine 0.40 - 1.50 mg/dL 7.82  9.56  2.13   Sodium 135 - 145 mEq/L 139  138  141   Potassium 3.5 - 5.1 mEq/L 4.2  4.6  4.5   Chloride 96 - 112 mEq/L 103  103  106   CO2 19 - 32 mEq/L 28  20  27    Calcium 8.4 - 10.5 mg/dL 08.6  57.8  46.9   Total Protein 6.0 - 8.3 g/dL 6.9   7.0   Total Bilirubin 0.2 - 1.2 mg/dL 0.5   0.4   Alkaline Phos 39 - 117 U/L 54   70   AST 0 - 37 U/L 13   20   ALT 0 - 53 U/L 14   24       Latest Ref Rng & Units 04/02/2021    4:22 PM 04/25/2020   10:17 AM 03/28/2020    9:15 AM  CBC  WBC 4.0 - 10.5 K/uL 7.1  7.4  6.8   Hemoglobin 13.0 - 17.0 g/dL 62.9  52.8  41.3   Hematocrit 39.0 - 52.0 % 40.1  41.2  41.1   Platelets 150.0 - 400.0 K/uL 219.0  220  228.0    Lab Results  Component Value Date   CHOL 194 04/02/2021   HDL 41.60 04/02/2021   LDLCALC 65 09/12/2020   LDLDIRECT 107.0 04/02/2021   TRIG 344.0 (H) 04/02/2021   CHOLHDL 5 04/02/2021     HEMOGLOBIN A1C Lab Results  Component Value Date   HGBA1C 5.6 03/28/2020   No results found for: "TSH"   Radiology:   CT chest 11/18/2021: 1. Lung-RADS 2S, benign appearance or behavior. Continue annual screening with low-dose chest CT without contrast in 12 months. 2. The "S" modifier above refers to potentially clinically significant non lung cancer related findings. Specifically,  there is aortic atherosclerosis, in addition to left main and three-vessel coronary artery disease.  There are calcifications of the mitral annulus.  3. Mild diffuse bronchial wall thickening with mild centrilobular and paraseptal emphysema; imaging findings suggestive of underlying COPD.  Cardiac Studies:   Lower Extremity  Arterial Duplex 03/22/2019:  No hemodynamically significant stenosis are identified in the lower  extremity arterial system.  Left mid SFA balloon angioplasty site is patent.  This exam reveals normal perfusion of the right lower extremity (ABI 1.09)  and mildly decreased perfusion of the left lower extremity, noted at the anterior tibial artery level (ABI 0.87).  Compared to 08/01/2015,  no  significant change in ABI.  Peripheral angiogram 05/02/2020: Abdominal angiogram reveals presence of 2 renal arteries on the left with mild ostial disease of the superior pole of 30%.  Single renal artery on the right with a 40% proximal stenosis with mild calcification.  Aortoiliac bifurcation is widely patent.  Mild atherosclerotic changes noted in the abdominal aorta. Left femoral arteriogram distal runoff: The left common iliac, external and internal iliac arteries are widely patent with mild disease.  The left SFA has mild diffuse disease of 30% with mild calcification.  Below the left knee, there is single-vessel runoff in the form of peroneal artery which appears to be dominant and gives collaterals to the posterior tibial artery at the level of the ankle. Prior PTA site in SFA from 2017 [facility] angioplasty site patent. NO change in below knee vasculature.  The small vessel disease involving the collateralized segment of the PT.  Lesion is mildly progressed since 2015. Right common and external iliac artery and internal iliac artery widely patent with mild disease.  Right SFA in the distal segment has a 40% stenosis which is calcific.  Brisk flow was noted all the way up to the ankle on the right.  There is two-vessel runoff, right anterior tibial is occluded. Impression: Symptoms of claudication are probably related to combination of small vessel disease which in the absence of limb threatening ischemia medical therapy advised and probably venous insufficiency and also severe degenerative joint disease, patient was  bone-on-bone at the hip and moderate DJD involving bilateral knee as well.  85 mill contrast utilized.  Medical therapy.  PCV ECHOCARDIOGRAM COMPLETE 10/27/2020  Narrative Echocardiogram 10/27/2020: Normal LV systolic function with visual EF 55-60%. Left ventricle cavity is normal in size. Moderate left ventricular hypertrophy. Normal global wall motion. Unable to evaluate diastolic function due to mitral annular calcification. Elevated LAP. Trace aortic regurgitation. Native mitral valve, annular calcification, mild (Grade I) mitral regurgitation. Mild tricuspid regurgitation. No evidence of pulmonary hypertension. RVSP measures 35 mmHg. IVC is dilated with a respiratory response of >50%. Compared to study 10/08/2016 no significant change.  Carotid artery duplex 04/22/2022: Duplex suggests stenosis in the right internal carotid artery (1-15%). < 50% stenosis in the right common carotid artery. < 50% stenosis in the right external carotid artery. Duplex suggests stenosis in the left internal carotid artery (1-15%). < 50% stenosis in the left common carotid artery. < 50% stenosis in the left external carotid artery. Antegrade right vertebral artery flow. Antegrade left vertebral artery flow. Compared to the study done on 08/03/2021, there is further regression of stenosis severity in the bilateral ICA.  Follow-up study in 1 year to confirm regression.  Previously right ICA 50 to 69% and left ICA 16 to 49% stenosis.    EKG:    *** EKG 11/05/2021: Normal sinus rhythm at rate of  64 bpm, left atrial enlargement, otherwise normal EKG.  Compared to 03/27/2021, no change.  Allergies & Medications   Allergies  Allergen Reactions   Micardis [Telmisartan] Swelling    Tongue swelling    Current Outpatient Medications:    cetirizine (ZYRTEC) 10 MG tablet, Take 10 mg by mouth daily., Disp: , Rfl:    Cholecalciferol (VITAMIN D3) 5000 units TABS, Take 5,000 Units by mouth daily., Disp: , Rfl:     clopidogrel (PLAVIX) 75 MG tablet, TAKE 1 TABLET BY MOUTH EVERY DAY, Disp: 90 tablet, Rfl: 1   Cyanocobalamin (B-12) 5000 MCG CAPS, Take 5,000 mcg by mouth daily., Disp: , Rfl:    ezetimibe (ZETIA) 10 MG tablet, TAKE 1 TABLET (10 MG TOTAL) BY MOUTH DAILY AFTER SUPPER., Disp: 90 tablet, Rfl: 3   finasteride (PROSCAR) 5 MG tablet, Take 5 mg by mouth daily., Disp: , Rfl:    metoprolol succinate (TOPROL-XL) 100 MG 24 hr tablet, TAKE 1 TABLET BY MOUTH TWICE DAILY WITH OR IMMEDIATELY FOLLOWING A MEAL, Disp: 180 tablet, Rfl: 2   olmesartan-hydrochlorothiazide (BENICAR HCT) 40-25 MG tablet, TAKE 1 TABLET BY MOUTH EVERY DAY IN THE MORNING, Disp: 90 tablet, Rfl: 3   rosuvastatin (CRESTOR) 20 MG tablet, TAKE 1 TABLET BY MOUTH EVERY DAY, Disp: 90 tablet, Rfl: 3   tetrahydrozoline 0.05 % ophthalmic solution, Place 1 drop into both eyes daily. Visine, Disp: , Rfl:    VASCEPA 1 g capsule, TAKE 2 CAPSULES BY MOUTH 2 TIMES DAILY., Disp: 120 capsule, Rfl: 3   vitamin C (ASCORBIC ACID) 500 MG tablet, Take 500 mg by mouth daily., Disp: , Rfl:    Assessment     ICD-10-CM   1. PAD (peripheral artery disease) (HCC)  I73.9     2. Bilateral carotid artery stenosis  I65.23     3. Essential hypertension, benign  I10     4. Mixed hyperlipidemia  E78.2       No orders of the defined types were placed in this encounter.  There are no discontinued medications.  Recommendations:   JAFAR POFFENBERGER  is a 77 y.o. Caucasian male patient with peripheral artery disease, PTA of left SFA on 09/07/2013. Repeat angiography on 05/02/2020 revealed patent angioplasty site below the left knee he had one vessel runoff and right leg showed mild diffuse disease and 2 vessel r/o. Peripheral neuropathy related to alcohol, tobacco use disorder, still smoking 1 pack of cigarettes a day, asymptomatic bilateral carotid artery stenosis, hyperlipidemia.  ***  Patient presents for 88-month follow-up.  Patient's symptoms of claudication and  dyspnea on exertion are stable.  No change in vascular exam, he does have faint right carotid bruit, absent pedal pulses but no evidence of critical limb ischemia.  He will need continued surveillance of his carotid artery stenosis.  I will discontinue aspirin, continue Plavix indefinitely.  No clinical evidence of heart failure.  Lipids are at goal except for elevated triglycerides, however he has lost about 12 to 14 pounds in weight over the past 6 months as he has been careful with his diet, he is also reduced alcohol intake.  We will repeat lipid profile testing. Patient is presently on Vascepa.    In view of >50-60-pack-year history of smoking, will obtain low-dose lung cancer screening CT.  Follow-up in 6 months, sooner if needed, for PAD,  hyperlipidemia.   Yates Decamp, PA-C 05/05/2022, 11:10 PM Office: [phone]

## 2022-05-06 ENCOUNTER — Encounter: Payer: Self-pay | Admitting: Cardiology

## 2022-05-06 ENCOUNTER — Ambulatory Visit: Payer: Medicare HMO | Admitting: Cardiology

## 2022-05-06 VITALS — BP 149/85 | HR 68 | Resp 16 | Ht 67.5 in | Wt 234.0 lb

## 2022-05-06 DIAGNOSIS — I6523 Occlusion and stenosis of bilateral carotid arteries: Secondary | ICD-10-CM

## 2022-05-06 DIAGNOSIS — E782 Mixed hyperlipidemia: Secondary | ICD-10-CM | POA: Diagnosis not present

## 2022-05-06 DIAGNOSIS — M170 Bilateral primary osteoarthritis of knee: Secondary | ICD-10-CM

## 2022-05-06 DIAGNOSIS — I739 Peripheral vascular disease, unspecified: Secondary | ICD-10-CM

## 2022-05-06 DIAGNOSIS — I10 Essential (primary) hypertension: Secondary | ICD-10-CM | POA: Diagnosis not present

## 2022-05-14 DIAGNOSIS — I739 Peripheral vascular disease, unspecified: Secondary | ICD-10-CM | POA: Diagnosis not present

## 2022-05-14 DIAGNOSIS — E782 Mixed hyperlipidemia: Secondary | ICD-10-CM | POA: Diagnosis not present

## 2022-05-16 LAB — LIPID PANEL WITH LDL/HDL RATIO
Cholesterol, Total: 107 mg/dL (ref 100–199)
HDL: 43 mg/dL (ref >39)
LDL Chol Calc (NIH): 39 mg/dL (ref 0–99)
LDL/HDL Ratio: 0.9 ratio (ref 0.0–3.6)
Triglycerides: 150 mg/dL — ABNORMAL HIGH (ref 0–149)
VLDL Cholesterol Cal: 25 mg/dL (ref 5–40)

## 2022-05-16 LAB — LIPOPROTEIN A (LPA): Lipoprotein (a): <8.4 nmol/L (ref <75.0)

## 2022-05-16 NOTE — Progress Notes (Signed)
Called patient no answer could not leave a vm due to vm being full

## 2022-05-16 NOTE — Progress Notes (Signed)
Patient wife called and was informed about lab results.

## 2022-05-26 ENCOUNTER — Emergency Department (HOSPITAL_COMMUNITY)
Admission: EM | Admit: 2022-05-26 | Discharge: 2022-06-08 | Disposition: E | Payer: Medicare HMO | Attending: Emergency Medicine | Admitting: Emergency Medicine

## 2022-05-26 DIAGNOSIS — F17210 Nicotine dependence, cigarettes, uncomplicated: Secondary | ICD-10-CM | POA: Diagnosis not present

## 2022-05-26 DIAGNOSIS — I10 Essential (primary) hypertension: Secondary | ICD-10-CM | POA: Insufficient documentation

## 2022-05-26 DIAGNOSIS — I469 Cardiac arrest, cause unspecified: Secondary | ICD-10-CM

## 2022-05-26 DIAGNOSIS — Z79899 Other long term (current) drug therapy: Secondary | ICD-10-CM | POA: Insufficient documentation

## 2022-05-26 DIAGNOSIS — R079 Chest pain, unspecified: Secondary | ICD-10-CM | POA: Diagnosis not present

## 2022-05-26 DIAGNOSIS — R464 Slowness and poor responsiveness: Secondary | ICD-10-CM | POA: Diagnosis not present

## 2022-05-27 ENCOUNTER — Telehealth: Payer: Self-pay | Admitting: Internal Medicine

## 2022-05-27 NOTE — Telephone Encounter (Signed)
Spoke to [employer]. She was asking if the Prostate Cancer could have been a metastasis from the skin cancer lesion he had removed a few years ago. I advised her I could not find that in his chart. It may be best to reach out to Dr Berneice Heinrich. She will contact his office.

## 2022-05-27 NOTE — Telephone Encounter (Signed)
[name] from [facility] called in and had some questions regarding Brian Clarke. She stated that he was a cornea donor. She can be reached at [phone]. Thank you!

## 2022-05-29 ENCOUNTER — Telehealth: Payer: Self-pay

## 2022-05-29 NOTE — Telephone Encounter (Signed)
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 Patient had SCC in June of 2020, biopsied by Dr. Armida Sans. aw

## 2022-06-08 NOTE — ED Triage Notes (Addendum)
Presents via [facility] CPR in progress.  Pt reported to his wife that he was experiencing CP starting approximately 2330. Went outside to smoke a cigarette and was later found unresponsive by wife outside after she realized pt had not returned. EMS arrived to patient pulseless, started CPR  at [facility].  En route, 12 epi, 1.5L NS provided. Intubated with 8.0, et CO2 50-60 with EMS. Pt asystole throughout. CPR paused in exam room of ER. Pt remains asystole. TOD called 0128.

## 2022-06-08 NOTE — ED Provider Notes (Signed)
[place] EMERGENCY DEPARTMENT AT [facility] Provider Note   CSN: [id number] Arrival date & time: 06/03/2022  0127     History  Chief Complaint  Patient presents with   Cardiac Arrest   Level 5 caveat due to acuity of condition CARDARIUS VIZCAYA is a 77 y.o. male.  The history is provided by the EMS personnel. The history is limited by the condition of the patient.  Patient with extensive history including hypertension, neuropathy, peripheral vascular disease presents in cardiac arrest.  Per EMS, patient had been reporting chest pain and went outside to smoke a cigarette.  He was later found unresponsive.  Bystander CPR was not initiated. EMS arrived the patient was pulseless in asystole at approximately 0028 CPR was initiated, patient received up to 12 epinephrines and was intubated. There was never return of spontaneous circulation.  Patient remained in asystole. Patient received CPR for almost an hour    Past Medical History:  Diagnosis Date   Anxiety    Arthritis    thumbs    Bilateral lower extremity edema    BPH (benign prostatic hypertrophy)    BPH with obstruction/lower urinary tract symptoms    Carotid stenosis, asymptomatic, bilateral    Claudication in peripheral vascular disease (HCC)    DOE (dyspnea on exertion)    GERD (gastroesophageal reflux disease)    occasional   History of kidney stones    Hyperlipidemia    Hypertension    followed by pcp and cardiology   Nephrolithiasis    Peripheral neuropathy    related to alcohol and tobacco    Personal history of colonic polyps    PVD (peripheral vascular disease) (HCC)    09-07-2013  s/p  drug-luting balloon angioplasy Left SFA and  right SFA- mild to moderate disease   Renal cyst urologist--- dr Berneice Heinrich   bilateral non-complex renal cysts, chronic     Home Medications Prior to Admission medications   Medication Sig Start Date End Date Taking? Authorizing Provider  cetirizine (ZYRTEC) 10 MG  tablet Take 10 mg by mouth daily.    [provider]  Cholecalciferol (VITAMIN D3) 5000 units TABS Take 5,000 Units by mouth daily.    [provider]  Cyanocobalamin (B-12) 5000 MCG CAPS Take 5,000 mcg by mouth daily.    [provider]  ezetimibe (ZETIA) 10 MG tablet TAKE 1 TABLET (10 MG TOTAL) BY MOUTH DAILY AFTER SUPPER. 04/15/22 04/10/23  Yates Decamp, MD  metoprolol succinate (TOPROL-XL) 100 MG 24 hr tablet TAKE 1 TABLET BY MOUTH TWICE DAILY WITH OR IMMEDIATELY FOLLOWING A MEAL 09/11/21   [name], MD  Multiple Vitamin (MULTIVITAMIN) tablet Take 1 tablet by mouth daily.    [provider]  olmesartan-hydrochlorothiazide (BENICAR HCT) 40-25 MG tablet TAKE 1 TABLET BY MOUTH EVERY DAY IN THE MORNING 01/02/22   Yates Decamp, MD  Omega-3 Fatty Acids (FISH OIL) 1200 MG CAPS Take 2 capsules by mouth daily.    [provider]  rosuvastatin (CRESTOR) 20 MG tablet TAKE 1 TABLET BY MOUTH EVERY DAY 12/27/21   Yates Decamp, MD      Allergies    Micardis [telmisartan]    Review of Systems   Review of Systems  Unable to perform ROS: Acuity of condition    Physical Exam Updated Vital Signs Physical Exam CONSTITUTIONAL: Unresponsive HEAD: Normocephalic/atraumatic, no visible trauma EYES: Pulls fixed and dilated ENMT: ET tube in place SPINE/BACK: No obvious signs of trauma to spine or back  CV: No spontaneous circulation LUNGS: Equal breath sounds noted with bagging ABDOMEN: soft, nondistended NEURO: Pt is unresponsive SKIN: warm   ED Results / Procedures / Treatments   Labs (all labs ordered are listed, but only abnormal results are displayed) Labs Reviewed - No data to display  EKG None  Radiology No results found.  Procedures CPR  Date/Time: 05/21/2022 1:41 AM  Performed by: Zadie Rhine, MD Authorized by: Zadie Rhine, MD  CPR Procedure Details:    ACLS/BLS initiated by EMS: Yes     CPR/ACLS performed in the ED: Yes      Duration of CPR (minutes):  2   Outcome: Pt declared dead    CPR performed via ACLS guidelines under my direct supervision.  See RN documentation for details including defibrillator use, medications, doses and timing. Comments:     Time of death 0128 AM     Medications Ordered in ED Medications - No data to display  ED Course/ Medical Decision Making/ A&P                             Medical Decision Making  Patient presents with CPR in progress.  Patient had been reporting chest pain, then was found unresponsive by family outside.  Patient received CPR for almost an hour without any return of spontaneous circulation. On arrival to the ED, patient was receiving CPR but was in asystole. Patient pronounced dead at 1:28 AM  Wife and son were updated with nursing present. Death certificate was completed by myself        Final Clinical Impression(s) / ED Diagnoses Final diagnoses:  Cardiac arrest [facility])    Rx / DC Orders ED Discharge Orders     None         Zadie Rhine, MD 05/19/2022 0231

## 2022-06-08 NOTE — Code Documentation (Signed)
Patient time of death occurred at 0128 

## 2022-06-08 NOTE — ED Triage Notes (Signed)
Pocket knife, check book, and $29.13 placed in belonging bag to be given to family

## 2022-06-08 NOTE — Progress Notes (Signed)
   06/01/2022 0215  Spiritual Encounters  Type of Visit Initial  Care provided to: [name] partners present during encounter Nurse  Referral source Nurse (RN/NT/LPN)  Reason for visit Patient death  OnCall Visit Yes   Chap responded to PT death. Found wife of 57 years in room with him, as well as son and daughter-in-law.  Family was emotional but composed.  Wife asked Brian Clarke to say a prayer.  Brian Clarke remained with family as they said their good byes and left.

## 2022-06-08 DEATH — deceased

## 2023-04-21 ENCOUNTER — Other Ambulatory Visit: Payer: Medicare HMO

## 2023-05-08 ENCOUNTER — Ambulatory Visit: Payer: Medicare HMO | Admitting: Cardiology
